# Patient Record
Sex: Male | Born: 1980 | Race: White | Hispanic: No | Marital: Married | Smoking: Former smoker
Health system: Southern US, Community
[De-identification: ages and names within clinical notes are randomized; demographics above are authoritative.]

## PROBLEM LIST (undated history)

## (undated) DIAGNOSIS — K222 Esophageal obstruction: Secondary | ICD-10-CM

## (undated) DIAGNOSIS — K200 Eosinophilic esophagitis: Secondary | ICD-10-CM

## (undated) HISTORY — DX: Eosinophilic esophagitis: K20.0

## (undated) HISTORY — PX: ANKLE ARTHROPLASTY: SUR68

## (undated) HISTORY — DX: Esophageal obstruction: K22.2

## (undated) HISTORY — PX: UPPER GASTROINTESTINAL ENDOSCOPY: SHX188

---
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
## 2001-03-16 ENCOUNTER — Ambulatory Visit (HOSPITAL_BASED_OUTPATIENT_CLINIC_OR_DEPARTMENT_OTHER): Admission: RE | Admit: 2001-03-16 | Discharge: 2001-03-16 | Payer: Self-pay

## 2012-07-23 ENCOUNTER — Other Ambulatory Visit: Payer: Self-pay | Admitting: Occupational Medicine

## 2012-07-23 ENCOUNTER — Ambulatory Visit (HOSPITAL_BASED_OUTPATIENT_CLINIC_OR_DEPARTMENT_OTHER)
Admission: RE | Admit: 2012-07-23 | Discharge: 2012-07-23 | Disposition: A | Discharge status: Home / Self Care | Payer: Self-pay | Source: Ambulatory Visit | Attending: Occupational Medicine | Admitting: Occupational Medicine

## 2012-07-23 ENCOUNTER — Ambulatory Visit (HOSPITAL_COMMUNITY)
Admission: RE | Admit: 2012-07-23 | Discharge: 2012-07-23 | Disposition: A | Discharge status: Home / Self Care | Payer: Self-pay | Source: Ambulatory Visit | Attending: Occupational Medicine | Admitting: Occupational Medicine

## 2012-07-23 DIAGNOSIS — Z0000 Encounter for general adult medical examination without abnormal findings: Secondary | ICD-10-CM | POA: Insufficient documentation

## 2012-07-24 ENCOUNTER — Ambulatory Visit (HOSPITAL_BASED_OUTPATIENT_CLINIC_OR_DEPARTMENT_OTHER)
Admission: RE | Admit: 2012-07-24 | Discharge: 2012-07-24 | Disposition: A | Discharge status: Home / Self Care | Payer: Self-pay | Source: Ambulatory Visit | Attending: Occupational Medicine | Admitting: Occupational Medicine

## 2012-07-24 ENCOUNTER — Other Ambulatory Visit: Payer: Self-pay | Admitting: Occupational Medicine

## 2012-07-24 DIAGNOSIS — Z0000 Encounter for general adult medical examination without abnormal findings: Secondary | ICD-10-CM | POA: Insufficient documentation

## 2015-06-13 ENCOUNTER — Encounter (HOSPITAL_BASED_OUTPATIENT_CLINIC_OR_DEPARTMENT_OTHER): Payer: Self-pay | Admitting: Emergency Medicine

## 2015-06-13 ENCOUNTER — Ambulatory Visit (HOSPITAL_BASED_OUTPATIENT_CLINIC_OR_DEPARTMENT_OTHER)
Admission: EM | Admit: 2015-06-13 | Discharge: 2015-06-13 | Disposition: A | Discharge status: Home / Self Care | Payer: Managed Care, Other (non HMO) | Attending: Gastroenterology | Admitting: Gastroenterology

## 2015-06-13 ENCOUNTER — Encounter (HOSPITAL_COMMUNITY)
Admission: EM | Disposition: A | Discharge status: Home / Self Care | Payer: Self-pay | Source: Home / Self Care | Attending: Emergency Medicine

## 2015-06-13 DIAGNOSIS — R609 Edema, unspecified: Secondary | ICD-10-CM | POA: Insufficient documentation

## 2015-06-13 DIAGNOSIS — T18128A Food in esophagus causing other injury, initial encounter: Secondary | ICD-10-CM

## 2015-06-13 DIAGNOSIS — K209 Esophagitis, unspecified: Secondary | ICD-10-CM | POA: Insufficient documentation

## 2015-06-13 DIAGNOSIS — R1310 Dysphagia, unspecified: Secondary | ICD-10-CM | POA: Diagnosis not present

## 2015-06-13 HISTORY — PX: ESOPHAGOGASTRODUODENOSCOPY: SHX5428

## 2015-06-13 SURGERY — EGD (ESOPHAGOGASTRODUODENOSCOPY)
Anesthesia: Moderate Sedation

## 2015-06-13 MED ORDER — FENTANYL CITRATE (PF) 100 MCG/2ML IJ SOLN
INTRAMUSCULAR | Status: AC
  Filled 2015-06-13: qty 2

## 2015-06-13 MED ORDER — DIPHENHYDRAMINE HCL 50 MG/ML IJ SOLN
INTRAMUSCULAR | Status: DC | PRN
  Administered 2015-06-13 (×2): 25 mg via INTRAVENOUS

## 2015-06-13 MED ORDER — SODIUM CHLORIDE 0.9 % IV SOLN: INTRAVENOUS | Status: DC

## 2015-06-13 MED ORDER — DIPHENHYDRAMINE HCL 50 MG/ML IJ SOLN
INTRAMUSCULAR | Status: AC
  Filled 2015-06-13: qty 1

## 2015-06-13 MED ORDER — OMEPRAZOLE MAGNESIUM 20 MG PO TBEC: 20.0000 mg | DELAYED_RELEASE_TABLET | Freq: Two times a day (BID) | ORAL | Status: DC

## 2015-06-13 MED ORDER — GLUCAGON HCL RDNA (DIAGNOSTIC) 1 MG IJ SOLR
1.0000 mg | Freq: Once | INTRAMUSCULAR | Status: AC
  Administered 2015-06-13: 1 mg via INTRAVENOUS

## 2015-06-13 MED ORDER — ONDANSETRON HCL 4 MG/2ML IJ SOLN
4.0000 mg | Freq: Once | INTRAMUSCULAR | Status: AC
  Administered 2015-06-13: 4 mg via INTRAVENOUS
  Filled 2015-06-13: qty 2

## 2015-06-13 MED ORDER — FENTANYL CITRATE (PF) 100 MCG/2ML IJ SOLN
INTRAMUSCULAR | Status: DC | PRN
  Administered 2015-06-13 (×4): 25 ug via INTRAVENOUS

## 2015-06-13 MED ORDER — SODIUM CHLORIDE 0.9 % IV SOLN
INTRAVENOUS | Status: DC
  Administered 2015-06-13: 500 mL via INTRAVENOUS

## 2015-06-13 MED ORDER — BUTAMBEN-TETRACAINE-BENZOCAINE 2-2-14 % EX AERO
INHALATION_SPRAY | CUTANEOUS | Status: DC | PRN
Start: 2015-06-13 — End: 2015-06-13
  Administered 2015-06-13: 2 via TOPICAL

## 2015-06-13 MED ORDER — MIDAZOLAM HCL 10 MG/2ML IJ SOLN
INTRAMUSCULAR | Status: DC | PRN
  Administered 2015-06-13: 1 mg via INTRAVENOUS
  Administered 2015-06-13 (×2): 2 mg via INTRAVENOUS

## 2015-06-13 MED ORDER — NITROGLYCERIN 0.4 MG SL SUBL
0.4000 mg | SUBLINGUAL_TABLET | SUBLINGUAL | Status: DC | PRN
  Administered 2015-06-13: 0.4 mg via SUBLINGUAL
  Filled 2015-06-13: qty 1

## 2015-06-13 MED ORDER — GLUCAGON HCL RDNA (DIAGNOSTIC) 1 MG IJ SOLR
INTRAMUSCULAR | Status: AC
  Administered 2015-06-13: 1 mg via INTRAVENOUS
  Filled 2015-06-13: qty 1

## 2015-06-13 MED ORDER — MIDAZOLAM HCL 5 MG/ML IJ SOLN
INTRAMUSCULAR | Status: AC
  Filled 2015-06-13: qty 2

## 2015-06-13 NOTE — ED Notes (Signed)
Pt has clear tracheal sounds, speech appears nml, denies any difficulty swallowing.

## 2015-06-13 NOTE — ED Notes (Signed)
Endoscopy RN here to take patient to Endo suite.

## 2015-06-13 NOTE — ED Notes (Signed)
Pt in c/o a food bolus, was eating a pork chop sandwich and a bite in lodged in esophagus. States has hx of same many years ago. Pt airway is intact, he is speaking in complete sentences with no obvious difficulty.

## 2015-06-13 NOTE — ED Notes (Signed)
States he still feels as if something is stuck in throat

## 2015-06-13 NOTE — ED Provider Notes (Signed)
CSN: [id number]     Arrival date & time 06/13/15  1444 History   None    Chief Complaint  Patient presents with  . Food Bolus      (Consider location/radiation/quality/duration/timing/severity/associated sxs/prior Treatment) HPI   Blood pressure 133/74, pulse 86, temperature 98.7 F (37.1 C), temperature source Oral, resp. rate 18, height 5\' 9"  (1.753 m), weight 230 lb (104.327 kg), SpO2 97 %.  Drew Cameron is a 34 y.o. male complaining of food bolus sensation in lower lower esophagus above the sternal notch. Patient states that he was eating a sandwich approximately 12:30 today in he feels that there is a piece lodged. He feels like it's moving slightly lower since onset, he tried to drink water to alleviate it hasn't helped. States it is felt slightly nauseous but he denies any shortness of breath, drooling. He states he had a similar episode when he was states several years ago he was not evaluated for it. In  History reviewed. No pertinent past medical history. History reviewed. No pertinent past surgical history. History reviewed. No pertinent family history. History  Substance Use Topics  . Smoking status: Never Smoker   . Smokeless tobacco: Never Used  . Alcohol Use: No    Review of Systems  10 systems reviewed and found to be negative, except as noted in the HPI.  Allergies  Review of patient's allergies indicates no known allergies.  Home Medications   Prior to Admission medications   Not on File   BP 133/74 mmHg  Pulse 86  Temp(Src) 98.7 F (37.1 C) (Oral)  Resp 18  Ht 5\' 9"  (1.753 m)  Wt 230 lb (104.327 kg)  BMI 33.95 kg/m2  SpO2 97% Physical Exam  Constitutional: He is oriented to person, place, and time. He appears well-developed and well-nourished. No distress.  HENT:  Head: Normocephalic.  Patient tolerating his secretions without S2, posterior pharynx with no trauma or swelling.  Eyes: Conjunctivae and EOM are normal.  Cardiovascular: Normal  rate, regular rhythm and intact distal pulses.   Pulmonary/Chest: Effort normal and breath sounds normal. No stridor. No respiratory distress. He has no wheezes. He has no rales. He exhibits no tenderness.  Abdominal: Soft. Bowel sounds are normal.  Musculoskeletal: Normal range of motion.  Neurological: He is alert and oriented to person, place, and time.  Psychiatric: He has a normal mood and affect.  Nursing note and vitals reviewed.   ED Course  Procedures (including critical care time) Labs Review Labs Reviewed - No data to display  Imaging Review No results found.   EKG Interpretation None      MDM   Final diagnoses:  Food impaction of esophagus, initial encounter    Filed Vitals:   06/13/15 1448  BP: 133/74  Pulse: 86  Temp: 98.7 F (37.1 C)  TempSrc: Oral  Resp: 18  Height: 5\' 9"  (1.753 m)  Weight: 230 lb (104.327 kg)  SpO2: 97%    Medications  nitroGLYCERIN (NITROSTAT) SL tablet 0.4 mg (0.4 mg Sublingual Given 06/13/15 1515)  glucagon (human recombinant) (GLUCAGEN) injection 1 mg (1 mg Intravenous Given 06/13/15 1457)  ondansetron (ZOFRAN) injection 4 mg (4 mg Intravenous Given 06/13/15 1501)    Drew Cameron is a pleasant 34 y.o. male presenting with food bolus likely pork from sandwich which she was eating around noon today. Patient is tolerating his secretions lung sounds are clear. Attempted IV glucagon and sublingual nitroglycerin with no relief. Dr. Fayrene Fearing has consulted gastroenterologist  Dr. Christella Hartigan who will see the patient in the ED at [facility] long.    Wynetta Emery, PA-C 06/13/15 1930  Rolland Porter, MD 06/16/15 [phone]

## 2015-06-13 NOTE — ED Notes (Signed)
Pt informed to be NPO, pending endo procedure

## 2015-06-13 NOTE — ED Provider Notes (Signed)
Pt seen and evaluated.  D/W PA [place].  Pt describes sudden onset of foreign body sensation that his sternal notch at 12:30 while eating a pork sandwich. Continues to describe discomfort. Clear lungs. Normal airway. Given IV glucagon 1 mg, Zofran 4 mg, sublingual nitroglycerin 0.4. Some improvement in discomfort. However, still unable to take by mouth or handle secretions. Care discussed with Dr. Christella Hartigan of lower GI. He request patient transferred to [facility] long for extraction.  Rolland Porter, MD 06/13/15 1630

## 2015-06-13 NOTE — Discharge Instructions (Signed)
Esophagogastroduodenoscopy °Care After °Refer to this sheet in the next few weeks. These instructions provide you with information on caring for yourself after your procedure. Your caregiver may also give you more specific instructions. Your treatment has been planned according to current medical practices, but problems sometimes occur. Call your caregiver if you have any problems or questions after your procedure.  °HOME CARE INSTRUCTIONS °· Do not eat or drink anything until the numbing medicine (local anesthetic) has worn off and your gag reflex has returned. You will know that the local anesthetic has worn off when you can swallow comfortably. °· Do not drive for 12 hours after the procedure or as directed by your caregiver. °· Only take medicines as directed by your caregiver. °SEEK MEDICAL CARE IF:  °· You cannot stop coughing. °· You are not urinating at all or less than usual. °SEEK IMMEDIATE MEDICAL CARE IF: °· You have difficulty swallowing. °· You cannot eat or drink. °· You have worsening throat or chest pain. °· You have dizziness, lightheadedness, or you faint. °· You have nausea or vomiting. °· You have chills. °· You have a fever. °· You have severe abdominal pain. °· You have black, tarry, or bloody stools. °Document Released: 10/10/2012 Document Reviewed: 10/10/2012 °ExitCare® Patient Information ©2015 [employer]. This information is not intended to replace advice given to you by your health care provider. Make sure you discuss any questions you have with your health care provider. ° °

## 2015-06-13 NOTE — Op Note (Signed)
[facility] [address] [place], [address]   ENDOSCOPY PROCEDURE REPORT  PATIENT: Drew Cameron, Drew Cameron  MR#: [id number] BIRTHDATE: [birth year] , 34  yrs. old GENDER: male ENDOSCOPIST: Rachael Fee, MD PROCEDURE DATE:  06/13/2015 PROCEDURE:  EGD w/ fb removal ASA CLASS:     Class I INDICATIONS:  food impaction in esophagus. MEDICATIONS: Benadryl 50 mg IV, Fentanyl 100 mcg IV, and Versed 5 mg IV TOPICAL ANESTHETIC: none  DESCRIPTION OF PROCEDURE: After the risks benefits and alternatives of the procedure were thoroughly explained, informed consent was obtained.  The Pentax Gastroscope [id number] endoscope was introduced through the mouth and advanced to the second portion of the duodenum , Without limitations.  The instrument was slowly withdrawn as the mucosa was fully examined.  There was a medium sized white bolus of food (meat) in the distal esophagus.  This was completely removed with a [employer].  After removal of the bolus, the esophagus was noted to have mild mucosal inflammation at the GE junction, edema without overt stricture. The UGI tract was otherwise normal.  Retroflexed views revealed no abnormalities.     The scope was then withdrawn from the patient and the procedure completed.  COMPLICATIONS: There were no immediate complications.  ENDOSCOPIC IMPRESSION: There was a medium sized white bolus of food (meat) in the distal esophagus.  This was completely removed with a [employer].  After removal of the bolus, the esophagus was noted to have mild mucosal inflammation at the GE junction, edema without overt stricture. The UGI tract was otherwise normal  RECOMMENDATIONS: Please start omeprazole  pill; take one pill 20-30 min before BF and dinner meals.  My office will contact you about repeat EGD in 3-4 weeks to assess for underlying stricture, EOE.  Chew your food well, eat slowly and take small bites.  eSigned:  Rachael Fee, MD  06/13/2015 6:09 PM

## 2015-06-13 NOTE — ED Notes (Signed)
Pt states he feels better, but noted to have a lot of belching, pt unable to drink water, EDP at bedside

## 2015-06-13 NOTE — H&P (View-Only) (Signed)
[facility] Referring Provider: Dr. Mark JAmes (ER at [place]_ Primary Care Physician:  No primary care provider on file. Primary Gastroenterologist:  None  Reason for Consultation: dysphagia   HPI:  Drew Cameron is a 34 y.o. male in usoh until lunch [place] [employer] this afternoon. Eating a pork chop sandwich, food felt to become lodged in substernum.  Has not been able to swallow his saliva. Breathing comfortably.  Went to [facility], nitro IV did not help. I was called and asked that he be transported to [facility] ED.  He does not take any meds. Does not have GERD.  STable weight. HE has had 3-4 episodes where it took an extra swallow or two to get food bolus down but never anything like this. No chest pains.     History reviewed. No pertinent past medical history.  History reviewed. No pertinent past surgical history.  Prior to Admission medications   Not on File    Current Facility-Administered Medications  Medication Dose Route Frequency Provider Last Rate Last Dose  . nitroGLYCERIN (NITROSTAT) SL tablet 0.4 mg  0.4 mg Sublingual Q5 min PRN Mark James, MD   0.4 mg at 06/13/15 1515   No current outpatient prescriptions on file.    Allergies as of 06/13/2015  . (No Known Allergies)    History reviewed. No pertinent family history.  History   Social History  . Marital Status: Married    Spouse Name: N/A  . Number of Children: N/A  . Years of Education: N/A   Occupational History  . Not on file.   Social History Main Topics  . Smoking status: Never Smoker   . Smokeless tobacco: Never Used  . Alcohol Use: No  . Drug Use: Not on file  . Sexual Activity: Not on file   Other Topics Concern  . Not on file   Social History Narrative  . No narrative on file     Review of Systems: Pertinent positive and negative review of systems were noted in the above HPI section. Complete review of systems was performed and was otherwise normal.   Physical  Exam: Vital signs in last 24 hours: Temp:  [98.6 F (37 C)-98.7 F (37.1 C)] 98.6 F (37 C) (08/06 1553) Pulse Rate:  [82-86] 82 (08/06 1553) Resp:  [16-18] 16 (08/06 1553) BP: (110-133)/(68-74) 110/68 mmHg (08/06 1553) SpO2:  [97 %] 97 % (08/06 1553) Weight:  [230 lb (104.327 kg)] 230 lb (104.327 kg) (08/06 1448)   Constitutional: generally well-appearing Psychiatric: alert and oriented x3 Eyes: extraocular movements intact Mouth: oral pharynx moist, no lesions Neck: supple no lymphadenopathy Cardiovascular: heart regular rate and rhythm Lungs: clear to auscultation bilaterally Abdomen: soft, nontender, nondistended, no obvious ascites, no peritoneal signs, normal bowel sounds Extremities: no lower extremity edema bilaterally Skin: no lesions on visible extremities    Impression/Plan: 34 y.o. male with esophageal food impaction  For EGD now.    Eulalia Ellerman P, MD  06/13/2015, 5:13 PM [facility] Pager [phone]    

## 2015-06-13 NOTE — ED Notes (Signed)
Phone Hand Off report given to Josh-RN with [employer]

## 2015-06-13 NOTE — ED Notes (Signed)
Dr Fayrene Fearing, MD now at bedside to evaluate pt

## 2015-06-13 NOTE — Consult Note (Signed)
[facility] Referring Provider: Dr. Rolland Porter (ER at [facility]_ Primary Care Physician:  No primary care provider on file. Primary Gastroenterologist:  None  Reason for Consultation: dysphagia   HPI:  Drew Cameron is a 34 y.o. male in usoh until lunch at [employer] this afternoon. Eating a pork chop sandwich, food felt to become lodged in substernum.  Has not been able to swallow his saliva. Breathing comfortably.  Went to [facility], nitro IV did not help. I was called and asked that he be transported to [facility] ED.  He does not take any meds. Does not have GERD.  STable weight. HE has had 3-4 episodes where it took an extra swallow or two to get food bolus down but never anything like this. No chest pains.     History reviewed. No pertinent past medical history.  History reviewed. No pertinent past surgical history.  Prior to Admission medications   Not on File    Current Facility-Administered Medications  Medication Dose Route Frequency Provider Last Rate Last Dose  . nitroGLYCERIN (NITROSTAT) SL tablet 0.4 mg  0.4 mg Sublingual Q5 min PRN Rolland Porter, MD   0.4 mg at 06/13/15 1515   No current outpatient prescriptions on file.    Allergies as of 06/13/2015  . (No Known Allergies)    History reviewed. No pertinent family history.  History   Social History  . Marital Status: Married    Spouse Name: N/A  . Number of Children: N/A  . Years of Education: N/A   Occupational History  . Not on file.   Social History Main Topics  . Smoking status: Never Smoker   . Smokeless tobacco: Never Used  . Alcohol Use: No  . Drug Use: Not on file  . Sexual Activity: Not on file   Other Topics Concern  . Not on file   Social History Narrative  . No narrative on file     Review of Systems: Pertinent positive and negative review of systems were noted in the above HPI section. Complete review of systems was performed and was otherwise normal.   Physical  Exam: Vital signs in last 24 hours: Temp:  [98.6 F (37 C)-98.7 F (37.1 C)] 98.6 F (37 C) (08/06 1553) Pulse Rate:  [82-86] 82 (08/06 1553) Resp:  [16-18] 16 (08/06 1553) BP: (110-133)/(68-74) 110/68 mmHg (08/06 1553) SpO2:  [97 %] 97 % (08/06 1553) Weight:  [230 lb (104.327 kg)] 230 lb (104.327 kg) (08/06 1448)   Constitutional: generally well-appearing Psychiatric: alert and oriented x3 Eyes: extraocular movements intact Mouth: oral pharynx moist, no lesions Neck: supple no lymphadenopathy Cardiovascular: heart regular rate and rhythm Lungs: clear to auscultation bilaterally Abdomen: soft, nontender, nondistended, no obvious ascites, no peritoneal signs, normal bowel sounds Extremities: no lower extremity edema bilaterally Skin: no lesions on visible extremities    Impression/Plan: 34 y.o. male with esophageal food impaction  For EGD now.    Rachael Fee, MD  06/13/2015, 5:13 PM [facility] Pager [phone]

## 2015-06-13 NOTE — Interval H&P Note (Signed)
History and Physical Interval Note:  06/13/2015 5:39 PM  Drew Cameron  has presented today for surgery, with the diagnosis of esophageal food impaction  The various methods of treatment have been discussed with the patient and family. After consideration of risks, benefits and other options for treatment, the patient has consented to  Procedure(s): ESOPHAGOGASTRODUODENOSCOPY (EGD) (N/A) as a surgical intervention .  The patient's history has been reviewed, patient examined, no change in status, stable for surgery.  I have reviewed the patient's chart and labs.  Questions were answered to the patient's satisfaction.     Rachael Fee

## 2015-06-13 NOTE — ED Notes (Signed)
Pt presents with having a food item stuck in throat, after eating a pork chop sandwich. Having nausea and some drooling upon admission

## 2015-06-13 NOTE — ED Notes (Signed)
GI MD at bedside to explain plan of care to patient. Pt RR even/unlabored. Mosie Lukes given to patient for saliva. No other c/c.

## 2015-06-15 ENCOUNTER — Encounter (HOSPITAL_COMMUNITY): Payer: Self-pay | Admitting: Gastroenterology

## 2015-06-15 ENCOUNTER — Telehealth: Payer: Self-pay

## 2015-06-15 NOTE — Telephone Encounter (Signed)
-----   Message from Rachael Fee, MD sent at 06/13/2015  6:10 PM EDT ----- He needs repeat EGD in [facility] with possible dilation (3-4 weeks) thanks

## 2015-06-16 ENCOUNTER — Encounter: Payer: Self-pay | Admitting: Gastroenterology

## 2015-06-16 NOTE — Telephone Encounter (Signed)
Pt has been scheduled for pre visit and EGD he is aware

## 2015-07-08 ENCOUNTER — Ambulatory Visit (AMBULATORY_SURGERY_CENTER): Payer: Self-pay

## 2015-07-08 VITALS — Ht 69.0 in | Wt 233.2 lb

## 2015-07-08 DIAGNOSIS — K222 Esophageal obstruction: Secondary | ICD-10-CM

## 2015-07-08 NOTE — Progress Notes (Signed)
No allergies to eggs or soy No home oxygen No diet/weight loss meds No past problems with anesthesia  Refused emmi 

## 2015-07-21 ENCOUNTER — Ambulatory Visit (AMBULATORY_SURGERY_CENTER): Payer: Managed Care, Other (non HMO) | Admitting: Gastroenterology

## 2015-07-21 ENCOUNTER — Encounter: Payer: Self-pay | Admitting: Gastroenterology

## 2015-07-21 VITALS — BP 109/68 | HR 64 | Temp 97.0°F | Resp 20 | Ht 69.0 in | Wt 233.0 lb

## 2015-07-21 DIAGNOSIS — R1310 Dysphagia, unspecified: Secondary | ICD-10-CM

## 2015-07-21 DIAGNOSIS — K222 Esophageal obstruction: Secondary | ICD-10-CM | POA: Diagnosis not present

## 2015-07-21 MED ORDER — OMEPRAZOLE MAGNESIUM 20 MG PO TBEC: 20.0000 mg | DELAYED_RELEASE_TABLET | Freq: Every day | ORAL | Status: DC

## 2015-07-21 MED ORDER — SODIUM CHLORIDE 0.9 % IV SOLN: 500.0000 mL | INTRAVENOUS | Status: DC

## 2015-07-21 NOTE — Progress Notes (Signed)
Transferred to recovery room. A/O x3, pleased with MAC.  VSS.  Report to Shelia, RN. 

## 2015-07-21 NOTE — Patient Instructions (Addendum)
YOU HAD AN ENDOSCOPIC PROCEDURE TODAY AT THE [facility]:   Refer to the procedure report that was given to you for any specific questions about what was found during the examination.  If the procedure report does not answer your questions, please call your gastroenterologist to clarify.  If you requested that your care partner not be given the details of your procedure findings, then the procedure report has been included in a sealed envelope for you to review at your convenience later.  YOU SHOULD EXPECT: Some feelings of bloating in the abdomen. Passage of more gas than usual.  Walking can help get rid of the air that was put into your GI tract during the procedure and reduce the bloating. If you had a lower endoscopy (such as a colonoscopy or flexible sigmoidoscopy) you may notice spotting of blood in your stool or on the toilet paper. If you underwent a bowel prep for your procedure, you may not have a normal bowel movement for a few days.  Please Note:  You might notice some irritation and congestion in your nose or some drainage.  This is from the oxygen used during your procedure.  There is no need for concern and it should clear up in a day or so.  SYMPTOMS TO REPORT IMMEDIATELY:     Following upper endoscopy (EGD)  Vomiting of blood or coffee ground material  New chest pain or pain under the shoulder blades  Painful or persistently difficult swallowing  New shortness of breath  Fever of 100F or higher  Black, tarry-looking stools  For urgent or emergent issues, a gastroenterologist can be reached at any hour by calling [phone].   DIET: FOLLOW DILATION HANDOUT.  ACTIVITY:  You should plan to take it easy for the rest of today and you should NOT DRIVE or use heavy machinery until tomorrow (because of the sedation medicines used during the test).    FOLLOW UP: Our staff will call the number listed on your records the next business day following your procedure to  check on you and address any questions or concerns that you may have regarding the information given to you following your procedure. If we do not reach you, we will leave a message.  However, if you are feeling well and you are not experiencing any problems, there is no need to return our call.  We will assume that you have returned to your regular daily activities without incident.  If any biopsies were taken you will be contacted by phone or by letter within the next 1-3 weeks.  Please call us at [phone] if you have not heard about the biopsies in 3 weeks.    SIGNATURES/CONFIDENTIALITY: You and/or your care partner have signed paperwork which will be entered into your electronic medical record.  These signatures attest to the fact that that the information above on your After Visit Summary has been reviewed and is understood.  Full responsibility of the confidentiality of this discharge information lies with you and/or your care-partner.  Decrease prilosec to once daily, resume remainder of medication. Dilation Diet Given.

## 2015-07-21 NOTE — Op Note (Signed)
[facility] [address] [place], [address]   ENDOSCOPY PROCEDURE REPORT  PATIENT: Drew, Cameron  MR#: [id number] BIRTHDATE: 21-Sep-1981 , 34  yrs. old GENDER: male ENDOSCOPIST: Rachael Fee, MD PROCEDURE DATE:  07/21/2015 PROCEDURE:  EGD w/ biopsy and EGD w/ balloon dilation ASA CLASS:     Class II INDICATIONS:  intermittent dysphagia, 1 month ago overt food impaction. MEDICATIONS: Monitored anesthesia care and Propofol 350 mg IV TOPICAL ANESTHETIC: none  DESCRIPTION OF PROCEDURE: After the risks benefits and alternatives of the procedure were thoroughly explained, informed consent was obtained.  The LB [id number] endoscope was introduced through the mouth and advanced to the second portion of the duodenum , Without limitations.  The instrument was slowly withdrawn as the mucosa was fully examined.  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  This was minor and was dilated with TTS balloon held inflated to 18mm for one minute.  There was no bleeding following the dilation.  There were linear furrows and some circumferential rings through the esophagus and so I biopsied the distal and proximal esophagus and sent to pathology.  The examination was otherwise normal.  Retroflexed views revealed no abnormalities.     The scope was then withdrawn from the patient and the procedure completed.  COMPLICATIONS: There were no immediate complications.  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  This was minor and was dilated with TTS balloon held inflated to 18mm for one minute.  There was no bleeding following the dilation.  There were linear furrows and some circumferential rings through the esophagus and so I biopsied the distal and proximal esophagus and sent to pathology.  The examination was otherwise  normal  RECOMMENDATIONS: Await final pathology.  Ok to decrease to once daily prilosec for now.   eSigned:  Rachael Fee, MD 07/21/2015 10:19 AM

## 2015-07-21 NOTE — Progress Notes (Signed)
Called to room to assist during endoscopic procedure.  Patient ID and intended procedure confirmed with present staff. Received instructions for my participation in the procedure from the performing physician.  

## 2015-07-22 ENCOUNTER — Telehealth: Payer: Self-pay

## 2015-07-22 NOTE — Telephone Encounter (Signed)
  Follow up Call-  Call back number 07/21/2015  Post procedure Call Back phone  # [phone]  Permission to leave phone message Yes     Patient questions:  Do you have a fever, pain , or abdominal swelling? No. Pain Score  0 *  Have you tolerated food without any problems? Yes.    Have you been able to return to your normal activities? Yes.    Do you have any questions about your discharge instructions: Diet   No. Medications  No. Follow up visit  No.  Do you have questions or concerns about your Care? No.  Actions: * If pain score is 4 or above: No action needed, pain <4.

## 2015-08-04 ENCOUNTER — Other Ambulatory Visit: Payer: Self-pay

## 2015-08-04 MED ORDER — OMEPRAZOLE 40 MG PO CPDR: 40.0000 mg | DELAYED_RELEASE_CAPSULE | Freq: Two times a day (BID) | ORAL | Status: DC

## 2015-08-04 NOTE — Telephone Encounter (Signed)
rx sent

## 2019-12-02 ENCOUNTER — Emergency Department (HOSPITAL_BASED_OUTPATIENT_CLINIC_OR_DEPARTMENT_OTHER)
Admission: EM | Admit: 2019-12-02 | Discharge: 2019-12-02 | Disposition: A | Discharge status: Home / Self Care | Payer: Managed Care, Other (non HMO) | Attending: Emergency Medicine | Admitting: Emergency Medicine

## 2019-12-02 ENCOUNTER — Encounter (HOSPITAL_BASED_OUTPATIENT_CLINIC_OR_DEPARTMENT_OTHER): Payer: Self-pay | Admitting: *Deleted

## 2019-12-02 ENCOUNTER — Other Ambulatory Visit: Payer: Self-pay

## 2019-12-02 ENCOUNTER — Emergency Department (HOSPITAL_BASED_OUTPATIENT_CLINIC_OR_DEPARTMENT_OTHER): Payer: Managed Care, Other (non HMO)

## 2019-12-02 DIAGNOSIS — N23 Unspecified renal colic: Secondary | ICD-10-CM

## 2019-12-02 DIAGNOSIS — Z79899 Other long term (current) drug therapy: Secondary | ICD-10-CM | POA: Diagnosis not present

## 2019-12-02 DIAGNOSIS — N201 Calculus of ureter: Secondary | ICD-10-CM

## 2019-12-02 DIAGNOSIS — N132 Hydronephrosis with renal and ureteral calculous obstruction: Secondary | ICD-10-CM | POA: Insufficient documentation

## 2019-12-02 DIAGNOSIS — Z87891 Personal history of nicotine dependence: Secondary | ICD-10-CM | POA: Insufficient documentation

## 2019-12-02 DIAGNOSIS — R1032 Left lower quadrant pain: Secondary | ICD-10-CM | POA: Diagnosis present

## 2019-12-02 LAB — URINALYSIS, MICROSCOPIC (REFLEX): Squamous Epithelial / LPF: NONE SEEN (ref 0–5)

## 2019-12-02 LAB — URINALYSIS, ROUTINE W REFLEX MICROSCOPIC
Bilirubin Urine: NEGATIVE
Glucose, UA: NEGATIVE mg/dL
Ketones, ur: NEGATIVE mg/dL
Leukocytes,Ua: NEGATIVE
Nitrite: NEGATIVE
Protein, ur: NEGATIVE mg/dL
Specific Gravity, Urine: >1.03 — ABNORMAL HIGH (ref 1.005–1.030)
pH: 6 (ref 5.0–8.0)

## 2019-12-02 MED ORDER — ONDANSETRON HCL 4 MG PO TABS: 4.0000 mg | ORAL_TABLET | Freq: Three times a day (TID) | ORAL | 0 refills | Status: DC | PRN

## 2019-12-02 MED ORDER — HYDROMORPHONE HCL 1 MG/ML IJ SOLN
1.0000 mg | Freq: Once | INTRAMUSCULAR | Status: AC
  Administered 2019-12-02: 1 mg via INTRAVENOUS
  Filled 2019-12-02: qty 1

## 2019-12-02 MED ORDER — KETOROLAC TROMETHAMINE 15 MG/ML IJ SOLN
15.0000 mg | Freq: Once | INTRAMUSCULAR | Status: AC
  Administered 2019-12-02: 15 mg via INTRAVENOUS
  Filled 2019-12-02: qty 1

## 2019-12-02 MED ORDER — IBUPROFEN 600 MG PO TABS: 600.0000 mg | ORAL_TABLET | Freq: Four times a day (QID) | ORAL | 0 refills | Status: DC | PRN

## 2019-12-02 MED ORDER — OXYCODONE-ACETAMINOPHEN 5-325 MG PO TABS: 1.0000 | ORAL_TABLET | ORAL | 0 refills | Status: DC | PRN

## 2019-12-02 MED ORDER — ONDANSETRON HCL 4 MG/2ML IJ SOLN
4.0000 mg | Freq: Once | INTRAMUSCULAR | Status: AC
  Administered 2019-12-02: 4 mg via INTRAVENOUS
  Filled 2019-12-02: qty 2

## 2019-12-02 NOTE — ED Notes (Signed)
Pt transported to CT ?

## 2019-12-02 NOTE — Discharge Instructions (Signed)
You have a 3 mm kidney stone passing on your left side.  I've included a copy of your CT report for your review.  Talk to your doctor about scheduling an outpatient ultrasound of your kidney for the lesion seen on the left kidney.

## 2019-12-02 NOTE — ED Provider Notes (Signed)
[facility] EMERGENCY DEPARTMENT Provider Note   CSN: [id number] Arrival date & time: 12/02/19  1811     History Chief Complaint  Patient presents with  . Flank Pain  . Abdominal Pain    Drew Cameron is a 39 y.o. male with a history of kidney stones present emergency department left-sided flank pain and dysuria.  Patient ports onset of symptoms this morning.  He says he is having pain in his left flank that wraps around towards his groin.  The pain is worse with urination.  He also reports nausea and vomiting.  He denies any fevers or chills.  He said his only had one kidney stone in the past and it came out spontaneously.  No other medical problems NKDA  HPI     Past Medical History:  Diagnosis Date  . Esophageal stricture     Patient Active Problem List   Diagnosis Date Noted  . Food impaction of esophagus     Past Surgical History:  Procedure Laterality Date  . ESOPHAGOGASTRODUODENOSCOPY N/A 06/13/2015   Procedure: ESOPHAGOGASTRODUODENOSCOPY (EGD);  Surgeon: Milus Banister, MD;  Location: [facility];  Service: Endoscopy;  Laterality: N/A;       Family History  Problem Relation Age of Onset  . Hiatal hernia Father   . Heart attack Father   . Prostate cancer Maternal Grandfather   . Colon cancer Neg Hx   . Colon polyps Neg Hx   . Stomach cancer Neg Hx     Social History   Tobacco Use  . Smoking status: Former Smoker    Years: 10.00  . Smokeless tobacco: Current User    Types: Chew  Substance Use Topics  . Alcohol use: Yes    Alcohol/week: 0.0 standard drinks    Comment: a few times yearly  . Drug use: No    Home Medications Prior to Admission medications   Medication Sig Start Date End Date Taking? Authorizing Provider  Multiple Vitamin (MULTIVITAMIN) tablet Take 1 tablet by mouth daily. Men's   Yes [provider]  Omega-3 Fatty Acids (FISH OIL) 1000 MG CAPS Take by mouth.   Yes [provider]  ibuprofen  (ADVIL) 600 MG tablet Take 1 tablet (600 mg total) by mouth every 6 (six) hours as needed. 12/02/19   Wyvonnia Dusky, MD  omeprazole (PRILOSEC) 40 MG capsule Take 1 capsule (40 mg total) by mouth 2 (two) times daily. 08/04/15   Milus Banister, MD  ondansetron (ZOFRAN) 4 MG tablet Take 1 tablet (4 mg total) by mouth every 8 (eight) hours as needed for up to 15 doses for nausea or vomiting. 12/02/19   Wyvonnia Dusky, MD  oxyCODONE-acetaminophen (PERCOCET/ROXICET) 5-325 MG tablet Take 1 tablet by mouth every 4 (four) hours as needed for up to 10 doses for severe pain. 12/02/19   Wyvonnia Dusky, MD    Allergies    Patient has no known allergies.  Review of Systems   Review of Systems  Constitutional: Negative for chills and fever.  Respiratory: Negative for cough and shortness of breath.   Cardiovascular: Negative for chest pain and palpitations.  Gastrointestinal: Positive for abdominal pain, nausea and vomiting.  Genitourinary: Positive for difficulty urinating, dysuria and flank pain.  Skin: Negative for color change and rash.  Psychiatric/Behavioral: Negative for agitation and confusion.  All other systems reviewed and are negative.   Physical Exam Updated Vital Signs BP (!) 146/84   Pulse 61   Temp  97.8 F (36.6 C) (Oral)   Resp 16   Ht 5\' 9"  (1.753 m)   Wt 97.5 kg   SpO2 100%   BMI 31.75 kg/m   Physical Exam Vitals and nursing note reviewed.  Constitutional:      General: He is in acute distress.     Appearance: He is well-developed.  HENT:     Head: Normocephalic and atraumatic.  Eyes:     Conjunctiva/sclera: Conjunctivae normal.  Cardiovascular:     Rate and Rhythm: Normal rate and regular rhythm.     Heart sounds: No murmur.  Pulmonary:     Effort: Pulmonary effort is normal. No respiratory distress.     Breath sounds: Normal breath sounds.  Abdominal:     Palpations: Abdomen is soft.     Tenderness: There is no abdominal tenderness. There is no right  CVA tenderness, left CVA tenderness, guarding or rebound.  Musculoskeletal:     Cervical back: Neck supple.  Skin:    General: Skin is warm and dry.  Neurological:     Mental Status: He is alert.  Psychiatric:        Mood and Affect: Mood normal.        Behavior: Behavior normal.     ED Results / Procedures / Treatments   Labs (all labs ordered are listed, but only abnormal results are displayed) Labs Reviewed  URINALYSIS, ROUTINE W REFLEX MICROSCOPIC - Abnormal; Notable for the following components:      Result Value   Specific Gravity, Urine >1.030 (*)    Hgb urine dipstick LARGE (*)    All other components within normal limits  URINALYSIS, MICROSCOPIC (REFLEX) - Abnormal; Notable for the following components:   Bacteria, UA FEW (*)    All other components within normal limits    EKG None  Radiology CT Renal Stone Study  Result Date: 12/02/2019 CLINICAL DATA:  Left flank pain EXAM: CT ABDOMEN AND PELVIS WITHOUT CONTRAST TECHNIQUE: Multidetector CT imaging of the abdomen and pelvis was performed following the standard protocol without IV contrast. COMPARISON:  None. FINDINGS: Lower chest: Lung bases are clear. No effusions. Heart is normal size. Hepatobiliary: Small layering gallstone within the gallbladder. No focal hepatic abnormality. Pancreas: No focal abnormality or ductal dilatation. Spleen: No focal abnormality.  Normal size. Adrenals/Urinary Tract: Punctate nonobstructing stone in the midpole of the left kidney. Mild left hydronephrosis due to 3 mm mid left ureteral stone. No additional ureteral stones. No hydronephrosis on the right. Adrenal glands and urinary bladder unremarkable. 1.5 cm low-density lesion in the midpole of the left kidney cannot be characterized on this noncontrast study. Stomach/Bowel: Stomach, large and small bowel grossly unremarkable. Appendix normal. Vascular/Lymphatic: No evidence of aneurysm or adenopathy. Reproductive: No visible focal  abnormality. Other: No free fluid or free air. Musculoskeletal: No acute bony abnormality. Bilateral L4 pars defects are noted. Slight anterolisthesis of L4 on L5. Degenerative disc disease at L4-5. IMPRESSION: 3 mm mid left ureteral stone with mild left hydronephrosis. Punctate nephrolithiasis in the left midpole. 1.5 cm low-density lesion in the midpole of the left kidney which cannot be characterized on this noncontrast study. This could be further evaluated with elective outpatient renal ultrasound if felt clinically indicated. Cholelithiasis. 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 Degenerative disc disease at L4-5. Electronically Signed   By: 12/04/2019 M.D.   On: 12/02/2019 19:06    Procedures Procedures (including critical care time)  Medications Ordered in ED Medications  HYDROmorphone (DILAUDID) injection  1 mg (1 mg Intravenous Given 12/02/19 1901)  ketorolac (TORADOL) 15 MG/ML injection 15 mg (15 mg Intravenous Given 12/02/19 1900)  ondansetron (ZOFRAN) injection 4 mg (4 mg Intravenous Given 12/02/19 1859)    ED Course  I have reviewed the triage vital signs and the nursing notes.  Pertinent labs & imaging results that were available during my care of the patient were reviewed by me and considered in my medical decision making (see chart for details).  39 yo male w/ hx of kidney stones here with sudden onset left flank pain, dysuria, nausea and vomiting.  CT scan with 3 mm left sided mid-ureteral stone.  Blood in urine, but no leuks or nitrites.  Do not suspect infection, pyelonephritis, or sepsis.  Will treat with IV medications, discharge home if pain and nausea better controlled.  Clinical Course as of Dec 01 2316  Mon Dec 02, 2019  1941 Feeling significantly better, will discharge   [MT]    Clinical Course User Index [MT] Toluwani Yadav, Kermit Balo, MD    Final Clinical Impression(s) / ED Diagnoses Final diagnoses:  Ureteral stone  Renal colic    Rx / DC Orders  ED Discharge Orders         Ordered    ibuprofen (ADVIL) 600 MG tablet  Every 6 hours PRN     12/02/19 1944    oxyCODONE-acetaminophen (PERCOCET/ROXICET) 5-325 MG tablet  Every 4 hours PRN     12/02/19 1944    ondansetron (ZOFRAN) 4 MG tablet  Every 8 hours PRN     12/02/19 1944           Terald Sleeper, MD 12/02/19 2318

## 2019-12-02 NOTE — ED Triage Notes (Signed)
Left flank pain and abdominal pain. Hx of kidney stones. Nausea.

## 2019-12-05 ENCOUNTER — Other Ambulatory Visit (HOSPITAL_BASED_OUTPATIENT_CLINIC_OR_DEPARTMENT_OTHER): Payer: Self-pay | Admitting: Physician Assistant

## 2019-12-05 ENCOUNTER — Other Ambulatory Visit (HOSPITAL_BASED_OUTPATIENT_CLINIC_OR_DEPARTMENT_OTHER): Payer: Self-pay | Admitting: *Deleted

## 2019-12-05 ENCOUNTER — Other Ambulatory Visit: Payer: Self-pay

## 2019-12-05 ENCOUNTER — Ambulatory Visit (HOSPITAL_BASED_OUTPATIENT_CLINIC_OR_DEPARTMENT_OTHER)
Admission: RE | Admit: 2019-12-05 | Discharge: 2019-12-05 | Disposition: A | Discharge status: Home / Self Care | Payer: Managed Care, Other (non HMO) | Source: Ambulatory Visit | Attending: Physician Assistant | Admitting: Physician Assistant

## 2019-12-05 DIAGNOSIS — R9389 Abnormal findings on diagnostic imaging of other specified body structures: Secondary | ICD-10-CM

## 2019-12-06 ENCOUNTER — Other Ambulatory Visit (HOSPITAL_BASED_OUTPATIENT_CLINIC_OR_DEPARTMENT_OTHER): Payer: Managed Care, Other (non HMO)

## 2020-05-20 ENCOUNTER — Telehealth: Payer: Self-pay | Admitting: Gastroenterology

## 2020-05-20 NOTE — Telephone Encounter (Signed)
The pt has not been seen since 2016, he complains of dysphagia with solid foods.  I have scheduled an appt with Dr Christella Hartigan for 9/15.  He has been advised to chew slowly, take small bites and if he gets food stuck and has any trouble breathing he should get to the ED ASAP.  The pt has been advised of the information and verbalized understanding.

## 2020-07-22 ENCOUNTER — Encounter: Payer: Self-pay | Admitting: Gastroenterology

## 2020-07-22 ENCOUNTER — Ambulatory Visit: Payer: Managed Care, Other (non HMO) | Admitting: Gastroenterology

## 2020-07-22 VITALS — BP 130/80 | HR 80 | Ht 69.0 in | Wt 239.0 lb

## 2020-07-22 DIAGNOSIS — R1310 Dysphagia, unspecified: Secondary | ICD-10-CM | POA: Diagnosis not present

## 2020-07-22 MED ORDER — OMEPRAZOLE 40 MG PO CPDR: 40.0000 mg | DELAYED_RELEASE_CAPSULE | Freq: Two times a day (BID) | ORAL | 3 refills | Status: DC

## 2020-07-22 NOTE — Progress Notes (Signed)
Review of pertinent gastrointestinal problems: 1.  Dysphagia led to EGD 2016 Dr. Christella Hartigan.  I dilated a focal stricture at his GE junction that was clearly benign.  I felt it was either peptic versus Schatzki's ring versus eosinophilic esophagitis associated.  There is some endoscopic findings suggestive of eosinophilic esophagitis; linear furrows and circumferential rings.  Biopsies were taken from distal and proximal esophagus which were consistent with eosinophilic esophagitis.  I recommended repeat EGD while on twice daily PPI.  We have not heard from him since.   HPI: This is a very pleasant 39 year old man whom I last saw about 5 years ago  I last saw him about 5 years ago, see those interactions summarized above.  After the EGD and dilation 5 years ago he had 0 dysphagia for at least 4-1/2 years.  Starting 2 or 3 months ago he began to have some dysphagia again.  He had an overt food impaction with chicken sandwich that lasted for about 3 hours.  He came very close to going to the emergency room.  He has also had minor intermittent reflux.  His weight has fluctuated he joined weight watchers about a year ago and lost 40 pounds but then he was diagnosed with Covid March 2021 and gained a lot of weight since then.  He has been very careful about chewing his food very well and as long as he is careful he does not have dysphagia.  Liquids do not cause troubles at all.  Review of systems: Pertinent positive and negative review of systems were noted in the above HPI section. All other review negative.   Past Medical History:  Diagnosis Date  . Esophageal stricture     Past Surgical History:  Procedure Laterality Date  . ESOPHAGOGASTRODUODENOSCOPY N/A 06/13/2015   Procedure: ESOPHAGOGASTRODUODENOSCOPY (EGD);  Surgeon: Rachael Fee, MD;  Location: [facility];  Service: Endoscopy;  Laterality: N/A;  . UPPER GASTROINTESTINAL ENDOSCOPY      Current Outpatient Medications  Medication Sig  Dispense Refill  . fexofenadine (ALLEGRA) 180 MG tablet Take 180 mg by mouth daily.    Marland Kitchen ibuprofen (ADVIL) 600 MG tablet Take 1 tablet (600 mg total) by mouth every 6 (six) hours as needed. 30 tablet 0  . Multiple Vitamin (MULTIVITAMIN) tablet Take 1 tablet by mouth daily. Men's    . Omega-3 Fatty Acids (FISH OIL) 1000 MG CAPS Take 1 capsule by mouth daily.      No current facility-administered medications for this visit.    Allergies as of 07/22/2020  . (No Known Allergies)    Family History  Problem Relation Age of Onset  . Hiatal hernia Father   . Heart attack Father   . Heart disease Father   . Prostate cancer Maternal Grandfather   . Colon cancer Maternal Grandfather   . Lung cancer Maternal Grandfather   . Colon polyps Neg Hx   . Stomach cancer Neg Hx     Social History   Socioeconomic History  . Marital status: Married    Spouse name: Not on file  . Number of children: Not on file  . Years of education: Not on file  . Highest education level: Not on file  Occupational History  . Not on file  Tobacco Use  . Smoking status: Former Smoker    Years: 10.00  . Smokeless tobacco: Current User    Types: Chew  Vaping Use  . Vaping Use: Never used  Substance and Sexual Activity  . Alcohol use:  Yes    Alcohol/week: 0.0 standard drinks    Comment: a few times yearly  . Drug use: No  . Sexual activity: Not on file  Other Topics Concern  . Not on file  Social History Narrative  . Not on file   Social Determinants of Health   Financial Resource Strain:   . Difficulty of Paying Living Expenses: Not on file  Food Insecurity:   . Worried About Programme researcher, broadcasting/film/video in the Last Year: Not on file  . Ran Out of Food in the Last Year: Not on file  Transportation Needs:   . Lack of Transportation (Medical): Not on file  . Lack of Transportation (Non-Medical): Not on file  Physical Activity:   . Days of Exercise per Week: Not on file  . Minutes of Exercise per Session:  Not on file  Stress:   . Feeling of Stress : Not on file  Social Connections:   . Frequency of Communication with Friends and Family: Not on file  . Frequency of Social Gatherings with Friends and Family: Not on file  . Attends Religious Services: Not on file  . Active Member of Clubs or Organizations: Not on file  . Attends Banker Meetings: Not on file  . Marital Status: Not on file  Intimate Partner Violence:   . Fear of Current or Ex-Partner: Not on file  . Emotionally Abused: Not on file  . Physically Abused: Not on file  . Sexually Abused: Not on file     Physical Exam: BP 130/80   Pulse 80   Ht 5\' 9"  (1.753 m)   Wt 239 lb (108.4 kg)   BMI 35.29 kg/m  Constitutional: generally well-appearing Psychiatric: alert and oriented x3 Eyes: extraocular movements intact Mouth: oral pharynx moist, no lesions Neck: supple no lymphadenopathy Cardiovascular: heart regular rate and rhythm Lungs: clear to auscultation bilaterally Abdomen: soft, nontender, nondistended, no obvious ascites, no peritoneal signs, normal bowel sounds Extremities: no lower extremity edema bilaterally Skin: no lesions on visible extremities   Assessment and plan: 39 y.o. 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  I recommended that we pick up his work-up where he left off in 2016 and so he will start proton pump inhibitor twice daily and in 4 or 5 weeks we will repeat EGD.  He knows to be very careful about chewing his food very well in the meantime.   Please see the "Patient Instructions" section for addition details about the plan.   2017, MD [facility] 07/22/2020, 9:01 AM  Cc: 07/24/2020, PA-C  Total time on date of encounter was 45  minutes (this included time spent preparing to see the patient reviewing records; obtaining and/or reviewing separately obtained history; performing a medically appropriate exam  and/or evaluation; counseling and educating the patient and family if present; ordering medications, tests or procedures if applicable; and documenting clinical information in the health record).

## 2020-07-22 NOTE — Addendum Note (Signed)
Addended by: Lamona Curl on: 07/22/2020 09:50 AM   Modules accepted: Orders

## 2020-07-22 NOTE — Patient Instructions (Signed)
If you are age 40 or older, your body mass index should be between 23-30. Your Body mass index is 35.29 kg/m. If this is out of the aforementioned range listed, please consider follow up with your Primary Care Provider.  If you are age 65 or younger, your body mass index should be between 19-25. Your Body mass index is 35.29 kg/m. If this is out of the aformentioned range listed, please consider follow up with your Primary Care Provider.   You have been scheduled for an endoscopy. Please follow written instructions given to you at your visit today. If you use inhalers (even only as needed), please bring them with you on the day of your procedure.  Due to recent changes in healthcare laws, you may see the results of your imaging and laboratory studies on MyChart before your provider has had a chance to review them.  We understand that in some cases there may be results that are confusing or concerning to you. Not all laboratory results come back in the same time frame and the provider may be waiting for multiple results in order to interpret others.  Please give [place] 48 hours in order for your provider to thoroughly review all the results before contacting the office for clarification of your results.   We have sent the following medications to your pharmacy for you to pick up at your convenience:  START:  Omeprazole 40mg  take one capsule twice daily before meals.  Thank you for entrusting me with your care and choosing [facility].  Dr [facility]

## 2020-08-14 ENCOUNTER — Telehealth: Payer: Self-pay | Admitting: Gastroenterology

## 2020-08-14 MED ORDER — OMEPRAZOLE 40 MG PO CPDR: 40.0000 mg | DELAYED_RELEASE_CAPSULE | Freq: Two times a day (BID) | ORAL | 3 refills | Status: DC

## 2020-08-14 NOTE — Telephone Encounter (Signed)
Rx for omeprazole sent to [facility] pharmacy as requested.

## 2020-08-14 NOTE — Telephone Encounter (Signed)
Pt needs rf for Omeprazole sent to [facility] in [facility]. He said that his insurance only covers 1 pill a day now so he is not sure of what to do because he takes 1 pill BID.

## 2020-08-21 ENCOUNTER — Ambulatory Visit (INDEPENDENT_AMBULATORY_CARE_PROVIDER_SITE_OTHER): Payer: Managed Care, Other (non HMO)

## 2020-08-21 ENCOUNTER — Other Ambulatory Visit: Payer: Self-pay | Admitting: Gastroenterology

## 2020-08-21 DIAGNOSIS — Z1159 Encounter for screening for other viral diseases: Secondary | ICD-10-CM

## 2020-08-21 LAB — SARS CORONAVIRUS 2 (TAT 6-24 HRS): SARS Coronavirus 2: NEGATIVE

## 2020-08-24 ENCOUNTER — Other Ambulatory Visit: Payer: Self-pay

## 2020-08-25 ENCOUNTER — Encounter: Payer: Managed Care, Other (non HMO) | Admitting: Gastroenterology

## 2020-08-28 ENCOUNTER — Telehealth: Payer: Self-pay

## 2020-08-28 ENCOUNTER — Encounter: Payer: Self-pay | Admitting: Gastroenterology

## 2020-08-28 ENCOUNTER — Other Ambulatory Visit: Payer: Self-pay

## 2020-08-28 ENCOUNTER — Ambulatory Visit (AMBULATORY_SURGERY_CENTER): Payer: Managed Care, Other (non HMO) | Admitting: Gastroenterology

## 2020-08-28 VITALS — BP 133/82 | HR 73 | Temp 98.6°F | Resp 18 | Ht 69.0 in | Wt 239.0 lb

## 2020-08-28 DIAGNOSIS — R1310 Dysphagia, unspecified: Secondary | ICD-10-CM | POA: Diagnosis not present

## 2020-08-28 DIAGNOSIS — K200 Eosinophilic esophagitis: Secondary | ICD-10-CM

## 2020-08-28 MED ORDER — SODIUM CHLORIDE 0.9 % IV SOLN: 500.0000 mL | Freq: Once | INTRAVENOUS | Status: DC

## 2020-08-28 NOTE — Op Note (Signed)
[facility] Patient Name: Drew Cameron [facility] Procedure Date: 08/28/2020 2:18 PM MRN: [id number] Endoscopist: Rachael Fee , MD Age: 39 Referring MD:  Date of Birth: [birth year] Gender: Male Account #: [id number] Procedure:                Upper GI endoscopy Indications:              Dysphagia EGD 2016 Dr. Christella Hartigan. focal stricture at                            his GE junction that was clearly benign (peptic                            versus Schatzki's ring versus eosinophilic                            esophagitis). There were some endoscopic findings                            suggestive of eosinophilic esophagitis; linear                            furrows and circumferential rings. Biopsies were                            taken from distal and proximal esophagus which were                            consistent with eosinophilic esophagitis. He has                            been on BID PPI for about 1 month now. Medicines:                Monitored Anesthesia Care Procedure:                Pre-Anesthesia Assessment:                           - Prior to the procedure, a History and Physical                            was performed, and patient medications and                            allergies were reviewed. The patient's tolerance of                            previous anesthesia was also reviewed. The risks                            and benefits of the procedure and the sedation                            options and risks were discussed with the patient.  All questions were answered, and informed consent                            was obtained. Prior Anticoagulants: The patient has                            taken no previous anticoagulant or antiplatelet                            agents. ASA Grade Assessment: II - A patient with                            mild systemic disease. After reviewing the risks                            and  benefits, the patient was deemed in                            satisfactory condition to undergo the procedure.                           After obtaining informed consent, the endoscope was                            passed under direct vision. Throughout the                            procedure, the patient's blood pressure, pulse, and                            oxygen saturations were monitored continuously. The                            Endoscope was introduced through the mouth, and                            advanced to the second part of duodenum. The upper                            GI endoscopy was accomplished without difficulty.                            The patient tolerated the procedure well. Scope In: Scope Out: Findings:                 Mucosal changes including longitudinal furrows and                            circumferential folds were found in the entire                            esophagus. Biopsies were obtained from the proximal  and distal esophagus with cold forceps for                            histology of suspected eosinophilic esophagitis.                           No esophageal strictures.                           The exam was otherwise without abnormality. Complications:            No immediate complications. Estimated blood loss:                            None. Estimated Blood Loss:     Estimated blood loss: none. 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 Biopsies taken (on PPI BID)                           - The examination was otherwise normal. Recommendation:           - Patient has a contact number available for                            emergencies. The signs and symptoms of potential                            delayed complications were discussed with the                            patient. Return to normal activities tomorrow.                             Written discharge instructions were provided to the                            patient.                           - Resume previous diet.                           - Continue present medications. Twice daily PPI.                           - Await pathology results. Rachael Fee, MD 08/28/2020 2:29:56 PM This report has been signed electronically.

## 2020-08-28 NOTE — Progress Notes (Signed)
1417 Robinul 0.1 mg IV given due large amount of secretions upon assessment.  MD made aware, vss

## 2020-08-28 NOTE — Progress Notes (Signed)
Report given to PACU, vss 

## 2020-08-28 NOTE — Telephone Encounter (Signed)
Pt. Called to confirm what time he is supposed to arrive for his procedure today.  Told pt. His procedure is scheduled for 2 p.m. today, and to arrive one hour early, at 1 p.m. today.  Told pt. We look forward to seeing him at 1 p.m. today.

## 2020-08-28 NOTE — Progress Notes (Signed)
Called to room to assist during endoscopic procedure.  Patient ID and intended procedure confirmed with present staff. Received instructions for my participation in the procedure from the performing physician.  

## 2020-08-28 NOTE — Patient Instructions (Signed)
YOU HAD AN ENDOSCOPIC PROCEDURE TODAY AT THE [facility]:   Refer to the procedure report that was given to you for any specific questions about what was found during the examination.  If the procedure report does not answer your questions, please call your gastroenterologist to clarify.  If you requested that your care partner not be given the details of your procedure findings, then the procedure report has been included in a sealed envelope for you to review at your convenience later.  YOU SHOULD EXPECT: Some feelings of bloating in the abdomen. Passage of more gas than usual.  Walking can help get rid of the air that was put into your GI tract during the procedure and reduce the bloating. If you had a lower endoscopy (such as a colonoscopy or flexible sigmoidoscopy) you may notice spotting of blood in your stool or on the toilet paper. If you underwent a bowel prep for your procedure, you may not have a normal bowel movement for a few days.  Please Note:  You might notice some irritation and congestion in your nose or some drainage.  This is from the oxygen used during your procedure.  There is no need for concern and it should clear up in a day or so.  SYMPTOMS TO REPORT IMMEDIATELY:    Following upper endoscopy (EGD)  Vomiting of blood or coffee ground material  New chest pain or pain under the shoulder blades  Painful or persistently difficult swallowing  New shortness of breath  Fever of 100F or higher  Black, tarry-looking stools  For urgent or emergent issues, a gastroenterologist can be reached at any hour by calling [phone]. Do not use MyChart messaging for urgent concerns.    DIET:  We do recommend a small meal at first, but then you may proceed to your regular diet.  Drink plenty of fluids but you should avoid alcoholic beverages for 24 hours.  ACTIVITY:  You should plan to take it easy for the rest of today and you should NOT DRIVE or use heavy machinery  until tomorrow (because of the sedation medicines used during the test).    FOLLOW UP: Our staff will call the number listed on your records 48-72 hours following your procedure to check on you and address any questions or concerns that you may have regarding the information given to you following your procedure. If we do not reach you, we will leave a message.  We will attempt to reach you two times.  During this call, we will ask if you have developed any symptoms of COVID 19. If you develop any symptoms (ie: fever, flu-like symptoms, shortness of breath, cough etc.) before then, please call [phone].  If you test positive for Covid 19 in the 2 weeks post procedure, please call and report this information to us.    If any biopsies were taken you will be contacted by phone or by letter within the next 1-3 weeks.  Please call us at [phone] if you have not heard about the biopsies in 3 weeks.    SIGNATURES/CONFIDENTIALITY: You and/or your care partner have signed paperwork which will be entered into your electronic medical record.  These signatures attest to the fact that that the information above on your After Visit Summary has been reviewed and is understood.  Full responsibility of the confidentiality of this discharge information lies with you and/or your care-partner. 

## 2020-08-28 NOTE — Progress Notes (Signed)
Vitals-CW  History reviewed. 

## 2020-09-01 ENCOUNTER — Telehealth: Payer: Self-pay

## 2020-09-01 NOTE — Telephone Encounter (Signed)
  Follow up Call-  Call back number 08/28/2020  Post procedure Call Back phone  # [phone]  Permission to leave phone message Yes  Some recent data might be hidden     Patient questions:  Do you have a fever, pain , or abdominal swelling? No. Pain Score  0 *  Have you tolerated food without any problems? Yes.    Have you been able to return to your normal activities? Yes.    Do you have any questions about your discharge instructions: Diet   No. Medications  No. Follow up visit  No.  Do you have questions or concerns about your Care? No.  Actions: * If pain score is 4 or above: No action needed, pain <4.  1. Have you developed a fever since your procedure? no  2.   Have you had an respiratory symptoms (SOB or cough) since your procedure? no  3.   Have you tested positive for COVID 19 since your procedure no  4.   Have you had any family members/close contacts diagnosed with the COVID 19 since your procedure?  no   If yes to any of these questions please route to Laverna Peace, RN and Karlton Lemon, RN

## 2020-09-04 ENCOUNTER — Other Ambulatory Visit: Payer: Self-pay

## 2020-09-04 DIAGNOSIS — K200 Eosinophilic esophagitis: Secondary | ICD-10-CM

## 2020-09-24 ENCOUNTER — Other Ambulatory Visit: Payer: Self-pay

## 2020-09-24 ENCOUNTER — Ambulatory Visit: Payer: Managed Care, Other (non HMO) | Admitting: Allergy

## 2020-09-24 ENCOUNTER — Telehealth: Payer: Self-pay | Admitting: Gastroenterology

## 2020-09-24 ENCOUNTER — Encounter: Payer: Self-pay | Admitting: Allergy

## 2020-09-24 VITALS — BP 128/88 | HR 71 | Temp 98.0°F | Resp 16 | Ht 70.28 in | Wt 233.0 lb

## 2020-09-24 DIAGNOSIS — K200 Eosinophilic esophagitis: Secondary | ICD-10-CM

## 2020-09-24 DIAGNOSIS — T781XXD Other adverse food reactions, not elsewhere classified, subsequent encounter: Secondary | ICD-10-CM | POA: Diagnosis not present

## 2020-09-24 DIAGNOSIS — T781XXA Other adverse food reactions, not elsewhere classified, initial encounter: Secondary | ICD-10-CM | POA: Insufficient documentation

## 2020-09-24 MED ORDER — OMEPRAZOLE 40 MG PO CPDR: 40.0000 mg | DELAYED_RELEASE_CAPSULE | Freq: Two times a day (BID) | ORAL | 3 refills | Status: DC

## 2020-09-24 NOTE — Telephone Encounter (Signed)
Rx for omeprazole sent to pharmacy as requested.  

## 2020-09-24 NOTE — Patient Instructions (Addendum)
Today's skin testing showed: Borderline to corn. Results given.   EoE: Continue with omeprazole 40mg  twice a day as prescribed by Dr. . Keep follow up with GI. Read handout on EoE. I doubt corn is a major trigger for you. Based on your dietary history - consider dairy and/or gluten elimination diet.   Follow up in 12 months or sooner if needed.

## 2020-09-24 NOTE — Assessment & Plan Note (Signed)
Diagnosed with eosinophilic esophagitis 5 years ago after an EGD requiring dilatation.  Patient was having dysphagia with various foods but mainly meats.  Symptoms subsided until 6 months ago and noted difficulty swallowing meats.  Recent EGD on 07/22/2020 showed elevated eosinophils of greater than 25 per hpf.  Started on omeprazole 40 mg twice a day with some benefit.  Consumes dairy, eggs and wheat on a daily basis.  Concerned for food allergy triggers.  Today's skin testing showed: Borderline to corn only. Results given.   Discussed with patient that EoE is a T cell mediated process and skin prick testing does not necessarily identity EoE food triggers. However, studies have shown that milk, gluten, eggs and peanuts/tree nuts are major EoE triggers.   Given above skin testing results, I'm not sure how much corn is a trigger for him as he does not consume it on a daily basis.  Based on his dietary history, recommend dairy and/or gluten elimination diet if still symptomatic. Ideally EGD with biopsy should be obtained after food elimination diet to monitor progress of eosinophils as clinical symptoms do not necessarily correlate with pathology.   Gave handout on eosinophilic esophagitis.  Continue with omeprazole 40mg  twice a day as prescribed by Dr. .  Keep follow up with GI.

## 2020-09-24 NOTE — Progress Notes (Signed)
New Patient Note  RE: Drew Cameron MRN: [id number] DOB: 10-Sep-1981 Date of Office Visit: 09/24/2020  Referring provider: Rachael FeeJacobs, Daniel P, MD Primary care provider: Lovenia KimHepler, Mark, PA-C  Chief Complaint: Allergy Testing (Food)  History of Present Illness: I had the pleasure of seeing Drew HammansWilliam Boland Cameron for initial evaluation at the [facility] on 09/24/2020. He is a 39 y.o. male, who is referred here by Dr. Christella HartiganJacobs (GI) for the evaluation of eosinophilic esophagitis.  Patient was diagnosed with EoE about 5 years ago. He had EGD with dilatations and biopsy which were consistent with EoE. He choked on pork chops while eating lunch at work and had to go to the ER.  Even before the above incident he had issues with choking on foods. Trigger foods include beef, chicken and pork.  Patient was doing well up until 6 months ago and noted similar symptoms as above after eating a chicken sandwich at [employer]. Currently on omeprazole 40mg  twice a day with some benefit but he still has a sensation in his esophagus. No additional choking episodes. Noted worsening symptoms when only taking omeprazole once a day. No prior food elimination diets or swallow steroids.   Last EGD on 07/22/20 did not require dilatation but biopsy showed elevated eosinophils.   Dietary History: patient has been eating other foods including daily milk, about daily eggs, frequently eating peanut and treenuts, rarely sesame, sometimes shellfish, fish, rarely soy, daily wheat, meats, fruits and vegetables.  07/22/2020 GI visit: "HPI: This is a very pleasant 39 year old man whom I last saw about 5 years ago  I last saw him about 5 years ago, see those interactions summarized above.  After the EGD and dilation 5 years ago he had 0 dysphagia for at least 4-1/2 years.  Starting 2 or 3 months ago he began to have some dysphagia again.  He had an overt food impaction with chicken sandwich that lasted for  about 3 hours.  He came very close to going to the emergency room.  He has also had minor intermittent reflux.  His weight has fluctuated he joined weight watchers about a year ago and lost 40 pounds but then he was diagnosed with Covid March 2021 and gained a lot of weight since then.  He has been very careful about chewing his food very well and as long as he is careful he does not have dysphagia.  Liquids do not cause troubles at all."  08/28/2020 EGD biopsy results:   Assessment and Plan: Drew Cameron is a 39 y.o. male with: Eosinophilic esophagitis Diagnosed with eosinophilic esophagitis 5 years ago after an EGD requiring dilatation.  Patient was having dysphagia with various foods but mainly meats.  Symptoms subsided until 6 months ago and noted difficulty swallowing meats.  Recent EGD on 07/22/2020 showed elevated eosinophils of greater than 25 per hpf.  Started on omeprazole 40 mg twice a day with some benefit.  Consumes dairy, eggs and wheat on a daily basis.  Concerned for food allergy triggers.  Today's skin testing showed: Borderline to corn only. Results given.   Discussed with patient that EoE is a T cell mediated process and skin prick testing does not necessarily identity EoE food triggers. However, studies have shown that milk, gluten, eggs and peanuts/tree nuts are major EoE triggers.   Given above skin testing results, I'm not sure how much corn is a trigger for him as he does not consume it on a daily basis.  Based on his dietary history, recommend dairy and/or gluten elimination diet if still symptomatic. Ideally EGD with biopsy should be obtained after food elimination diet to monitor progress of eosinophils as clinical symptoms do not necessarily correlate with pathology.   Gave handout on eosinophilic esophagitis.  Continue with omeprazole 40mg  twice a day as prescribed by Dr. .  Keep follow up with GI.  Return in about 1 year (around 09/24/2021).  Other allergy  screening: Asthma: no Rhino conjunctivitis: yes  Mild rhinitis symptoms during seasonal changes and takes OTC antihistamines with some benefit.  Medication allergy: no Hymenoptera allergy: no Urticaria: no Eczema:no History of recurrent infections suggestive of immunodeficency: no  Diagnostics: Skin Testing: Food allergy panel. Positive test to: borderline to corn.  Results discussed with patient/family.  Food Adult Perc - 09/24/20 1100    Time Antigen Placed 1118    Allergen Manufacturer 09/26/20    Location Back    Number of allergen test 74    Panel 2 Select     Control-buffer 50% Glycerol Negative    Control-Histamine 1 mg/ml 2+    1. Peanut Negative    2. Soybean Negative    3. Wheat Negative    4. Sesame Negative    5. Milk, cow Negative    6. Egg White, Chicken Negative    7. Casein Negative    8. Shellfish Mix Negative    9. Fish Mix Negative    10. Cashew Negative    11. Pecan Food Negative    12. Walnut Food Negative    13. Almond Negative    14. Hazelnut Negative    15. Waynette Buttery nut Negative    16. Coconut Negative    17. Pistachio Negative    18. Catfish Negative    19. Bass Negative    20. Trout Negative    21. Tuna Negative    22. Salmon Negative    23. Flounder Negative    24. Codfish Negative    25. Shrimp Negative    26. Crab Negative    27. Lobster Negative    28. Oyster Negative    29. Scallops Negative    30. Barley Negative    31. Oat  Negative    32. Rye  Negative    33. Hops Negative    34. Rice Negative    35. Cottonseed Negative    36. Saccharomyces Cerevisiae  Negative    37. Pork Negative    38. [place] Meat Negative    39. Chicken Meat Negative    40. Beef Negative    41. Lamb Negative    42. Tomato Negative    43. White Potato Negative    44. Sweet Potato Negative    45. Pea, Green/English Negative    46. Navy Bean Negative    47. Mushrooms Negative    48. Avocado Negative    49. Onion Negative    50. Cabbage Negative     51. Carrots Negative    52. Celery Negative    53. Corn --   +/-   54. Cucumber Negative    55. Grape (White seedless) Negative    56. Orange  Negative    57. Banana Negative    58. Apple Negative    59. Peach Negative    60. Strawberry Negative    61. Cantaloupe Negative    62. Watermelon Negative    63. Pineapple Negative    64. Chocolate/Cacao bean Negative    65.  Karaya Gum Negative    66. Acacia (Arabic Gum) Negative    67. Cinnamon Negative    68. Nutmeg Negative    69. Ginger Negative    70. Garlic Negative    71. Pepper, black Negative    72. Mustard Negative           Past Medical History: Patient Active Problem List   Diagnosis Date Noted  . Eosinophilic esophagitis 09/24/2020  . Adverse food reaction 09/24/2020  . Food impaction of esophagus    Past Medical History:  Diagnosis Date  . Esophageal stricture    Past Surgical History: Past Surgical History:  Procedure Laterality Date  . ANKLE ARTHROPLASTY    . ESOPHAGOGASTRODUODENOSCOPY N/A 06/13/2015   Procedure: ESOPHAGOGASTRODUODENOSCOPY (EGD);  Surgeon: Rachael Fee, MD;  Location: [facility];  Service: Endoscopy;  Laterality: N/A;  . UPPER GASTROINTESTINAL ENDOSCOPY     Medication List:  Current Outpatient Medications  Medication Sig Dispense Refill  . ibuprofen (ADVIL) 600 MG tablet Take 1 tablet (600 mg total) by mouth every 6 (six) hours as needed. 30 tablet 0  . Multiple Vitamin (MULTIVITAMIN) tablet Take 1 tablet by mouth daily. Men's    . Omega-3 Fatty Acids (FISH OIL) 1000 MG CAPS Take 1 capsule by mouth daily.     Marland Kitchen omeprazole (PRILOSEC) 40 MG capsule Take 1 capsule (40 mg total) by mouth 2 (two) times daily. 60 capsule 3   No current facility-administered medications for this visit.   Allergies: No Known Allergies Social History: Social History   Socioeconomic History  . Marital status: Married    Spouse name: Not on file  . Number of children: Not on file  . Years of education:  Not on file  . Highest education level: Not on file  Occupational History  . Not on file  Tobacco Use  . Smoking status: Former Smoker    Years: 10.00  . Smokeless tobacco: Current User    Types: Chew  Vaping Use  . Vaping Use: Never used  Substance and Sexual Activity  . Alcohol use: Yes    Alcohol/week: 0.0 standard drinks    Comment: a few times yearly  . Drug use: No  . Sexual activity: Not on file  Other Topics Concern  . Not on file  Social History Narrative  . Not on file   Social Determinants of Health   Financial Resource Strain:   . Difficulty of Paying Living Expenses: Not on file  Food Insecurity:   . Worried About Programme researcher, broadcasting/film/video in the Last Year: Not on file  . Ran Out of Food in the Last Year: Not on file  Transportation Needs:   . Lack of Transportation (Medical): Not on file  . Lack of Transportation (Non-Medical): Not on file  Physical Activity:   . Days of Exercise per Week: Not on file  . Minutes of Exercise per Session: Not on file  Stress:   . Feeling of Stress : Not on file  Social Connections:   . Frequency of Communication with Friends and Family: Not on file  . Frequency of Social Gatherings with Friends and Family: Not on file  . Attends Religious Services: Not on file  . Active Member of Clubs or Organizations: Not on file  . Attends Banker Meetings: Not on file  . Marital Status: Not on file   Lives in a house which is 10 years. Smoking: no Occupation: Ecologist HistorySurveyor, minerals in  the house: no Carpet in the family room: yes Carpet in the bedroom: yes Heating: electric Cooling: heat pump Pet: outdoor dog, goat, chicken  Family History: Family History  Problem Relation Age of Onset  . Hiatal hernia Father   . Heart attack Father   . Heart disease Father   . Asthma Father   . Prostate cancer Maternal Grandfather   . Colon cancer Maternal Grandfather   . Lung cancer Maternal  Grandfather   . Pancreatic cancer Maternal Grandfather   . Diabetes Mother   . Colon polyps Neg Hx   . Stomach cancer Neg Hx   . Esophageal cancer Neg Hx   . Liver cancer Neg Hx   . Rectal cancer Neg Hx   . Urticaria Neg Hx   . Eczema Neg Hx   . Allergic rhinitis Neg Hx    Problem                               Relation EoE    No  Review of Systems  Constitutional: Negative for appetite change, chills, fever and unexpected weight change.  HENT: Negative for congestion and rhinorrhea.   Eyes: Negative for itching.  Respiratory: Negative for cough, chest tightness, shortness of breath and wheezing.   Cardiovascular: Negative for chest pain.  Gastrointestinal: Negative for abdominal pain.  Genitourinary: Negative for difficulty urinating.  Skin: Negative for rash.  Neurological: Negative for headaches.   Objective: BP 128/88 (BP Location: Right Arm, Patient Position: Sitting, Cuff Size: Normal)   Pulse 71   Temp 98 F (36.7 C) (Temporal)   Resp 16   Ht 5' 10.28" (1.785 m)   Wt 233 lb (105.7 kg)   SpO2 97%   BMI 33.17 kg/m  Body mass index is 33.17 kg/m. Physical Exam Vitals and nursing note reviewed.  Constitutional:      Appearance: Normal appearance. He is well-developed.  HENT:     Head: Normocephalic and atraumatic.     Right Ear: External ear normal. There is impacted cerumen.     Left Ear: External ear normal.     Nose: Nose normal.     Mouth/Throat:     Mouth: Mucous membranes are moist.     Pharynx: Oropharynx is clear.  Eyes:     Conjunctiva/sclera: Conjunctivae normal.  Cardiovascular:     Rate and Rhythm: Normal rate and regular rhythm.     Heart sounds: Normal heart sounds. No murmur heard.  No friction rub. No gallop.   Pulmonary:     Effort: Pulmonary effort is normal.     Breath sounds: Normal breath sounds. No wheezing, rhonchi or rales.  Abdominal:     Palpations: Abdomen is soft.  Musculoskeletal:     Cervical back: Neck supple.  Skin:     General: Skin is warm.     Findings: No rash.  Neurological:     Mental Status: He is alert and oriented to person, place, and time.  Psychiatric:        Behavior: Behavior normal.    The plan was reviewed with the patient/family, and all questions/concerned were addressed.  It was my pleasure to see Feliciano today and participate in his care. Please feel free to contact me with any questions or concerns.  Sincerely,  Wyline Mood, DO Allergy & Immunology  [facility] office: [phone] [facility] office: [phone]

## 2020-09-25 NOTE — Telephone Encounter (Signed)
Patient's insurance will not cover Omeprazole 40mg  one capsule BID.  Patient had to pay out-of-pocket for medications.  Insurance is requiring prior auth to be completed, but patient has never tried and failed any other medications so it will not be approved.   Please advise

## 2020-09-28 ENCOUNTER — Other Ambulatory Visit: Payer: Managed Care, Other (non HMO)

## 2020-09-28 ENCOUNTER — Encounter: Payer: Self-pay | Admitting: Physician Assistant

## 2020-09-28 ENCOUNTER — Ambulatory Visit: Payer: Managed Care, Other (non HMO) | Admitting: Physician Assistant

## 2020-09-28 VITALS — BP 130/100 | HR 76 | Ht 69.0 in | Wt 240.4 lb

## 2020-09-28 DIAGNOSIS — K200 Eosinophilic esophagitis: Secondary | ICD-10-CM

## 2020-09-28 MED ORDER — FLUTICASONE PROPIONATE HFA 220 MCG/ACT IN AERO: INHALATION_SPRAY | RESPIRATORY_TRACT | 2 refills | Status: DC

## 2020-09-28 NOTE — Progress Notes (Signed)
Subjective:    Patient ID: Drew Cameron, male    DOB: Sep 14, 1981, 39 y.o.   MRN: [id number]  HPI Drew Cameron is a pleasant 39 year old white male, established with Dr. Christella Hartigan who comes in today for follow-up of eosinophilic esophagitis. Patient had prior diagnosis of eosinophilic esophagitis in 2016. He underwent recent EGD on 08/28/2020 for symptoms of GERD and dysphagia. He was found to have mucosal changes consistent with eosinophilic esophagitis with longitudinal furrows and thickened folds in the entire esophagus. Path continue to show elevated eosinophils. He has been started on twice daily omeprazole which she has now been taking over the past month. He had also been referred to allergy for food allergy testing. He says he has completed 30 food allergy testing and was told that he was mildly positive only for corn. He has just obtained a list of all of the numerous food products containing corn corn syrup etc. He says that heartburn and indigestion is better on the omeprazole but he continues to have a sensation of dysphagia and a knot-like sensation while food is traversing his esophagus. He gets some sensation that food may stop in his esophagus or hang up especially with meats though he has not had any episodes requiring regurgitation. He mentions that the allergist suggested a food elimination diet if he is having persistent symptoms. Avoidance of dairy products and gluten was mentioned.  Review of Systems Pertinent positive and negative review of systems were noted in the above HPI section.  All other review of systems was otherwise negative.  Outpatient Encounter Medications as of 09/28/2020  Medication Sig  . Multiple Vitamin (MULTIVITAMIN) tablet Take 1 tablet by mouth daily. Men's  . Omega-3 Fatty Acids (FISH OIL) 1000 MG CAPS Take 1 capsule by mouth daily.   Marland Kitchen omeprazole (PRILOSEC) 40 MG capsule Take 1 capsule (40 mg total) by mouth 2 (two) times daily.  . fluticasone (FLOVENT  HFA) 220 MCG/ACT inhaler Swallow 2 sprays into mouth twice daily, DO NOT eat for 30 minutes after use.  . [DISCONTINUED] ibuprofen (ADVIL) 600 MG tablet Take 1 tablet (600 mg total) by mouth every 6 (six) hours as needed.   No facility-administered encounter medications on file as of 09/28/2020.   No Known Allergies Patient Active Problem List   Diagnosis Date Noted  . Eosinophilic esophagitis 09/24/2020  . Adverse food reaction 09/24/2020  . Food impaction of esophagus    Social History   Socioeconomic History  . Marital status: Married    Spouse name: Not on file  . Number of children: Not on file  . Years of education: Not on file  . Highest education level: Not on file  Occupational History  . Not on file  Tobacco Use  . Smoking status: Former Smoker    Years: 10.00  . Smokeless tobacco: Current User    Types: Chew  Vaping Use  . Vaping Use: Never used  Substance and Sexual Activity  . Alcohol use: Yes    Alcohol/week: 0.0 standard drinks    Comment: a few times yearly  . Drug use: No  . Sexual activity: Not on file  Other Topics Concern  . Not on file  Social History Narrative  . Not on file   Social Determinants of Health   Financial Resource Strain:   . Difficulty of Paying Living Expenses: Not on file  Food Insecurity:   . Worried About Programme researcher, broadcasting/film/video in the Last Year: Not on file  .  Ran Out of Food in the Last Year: Not on file  Transportation Needs:   . Lack of Transportation (Medical): Not on file  . Lack of Transportation (Non-Medical): Not on file  Physical Activity:   . Days of Exercise per Week: Not on file  . Minutes of Exercise per Session: Not on file  Stress:   . Feeling of Stress : Not on file  Social Connections:   . Frequency of Communication with Friends and Family: Not on file  . Frequency of Social Gatherings with Friends and Family: Not on file  . Attends Religious Services: Not on file  . Active Member of Clubs or  Organizations: Not on file  . Attends Banker Meetings: Not on file  . Marital Status: Not on file  Intimate Partner Violence:   . Fear of Current or Ex-Partner: Not on file  . Emotionally Abused: Not on file  . Physically Abused: Not on file  . Sexually Abused: Not on file    Drew Cameron's family history includes Asthma in his father; Colon cancer in his maternal grandfather; Diabetes in his mother; Heart attack in his father; Heart disease in his father; Hiatal hernia in his father; Lung cancer in his maternal grandfather; Pancreatic cancer in his maternal grandfather; Prostate cancer in his maternal grandfather.      Objective:    Vitals:   09/28/20 0915  BP: (!) 130/100  Pulse: 76    Physical Exam Well-developed well-nourished WM in no acute distress.  Height, Weight, 240 BMI 35.5  HEENT; nontraumatic normocephalic, EOMI, PE R LA, sclera anicteric.  Neuro/Psych; alert and oriented x4, grossly nonfocal mood and affect appropriate       Assessment & Plan:   #58 39 year old white male with eosinophilic esophagitis and GERD.  Recent EGD 08/28/2020 with elevated eosinophils and mucosal changes consistent with eosinophilic esophagitis. He has had improvement in GERD symptoms on twice daily omeprazole but continues to have dysphagia sensation, worse with meats.  #2 Food allergy testing mildly positive for corn/corn products  Plan; continue omeprazole 40 mg p.o. twice daily. This is in the process of being authorized as his insurance was not wanting to cover twice daily dosing.  We will add fluticasone spray 220 mcg 2 sprays twice daily into the mouth to be swallowed, and no eating or drinking for 30 minutes after each dose. We will plan a 6 to 8-week course.  Check TTG and IgA Avoidance of corn and corn products If he does not have good response to above, then at time of follow-up can discuss further food elimination process probably starting with avoidance of  dairy. Patient will follow up with Dr. Christella Hartigan or myself in about 8 weeks.  Drew Cameron [name] PA-C 09/28/2020   Cc: Lovenia Kim, PA-C

## 2020-09-28 NOTE — Telephone Encounter (Signed)
Pt would like to inform Joni Reining he received her message and he appreciates her help.

## 2020-09-28 NOTE — Patient Instructions (Addendum)
If you are age 40 or older, your body mass index should be between 23-30. Your Body mass index is 35.5 kg/m. If this is out of the aforementioned range listed, please consider follow up with your Primary Care Provider.  If you are age 65 or younger, your body mass index should be between 19-25. Your Body mass index is 35.5 kg/m. If this is out of the aformentioned range listed, please consider follow up with your Primary Care Provider.   Your provider has requested that you go to the basement level for lab work before leaving today. Press "B" on the elevator. The lab is located at the first door on the left as you exit the elevator.  Continue Omeprazole 40 mg 1 capsule twice daily  START Fluticasone Inhaler, Swallow 2 sprays twice a day. DO NOT eat for at least 30 minutes after use.  You have been scheduled to follow up with Dr. Christella Hartigan on November 27, 2020 at 9:50 am.

## 2020-09-28 NOTE — Progress Notes (Signed)
I agree with the above note, plan 

## 2020-09-28 NOTE — Telephone Encounter (Signed)
Okay, thanks.  Can you find out what other medicines he needs to try and fail?  I am happy to try them.

## 2020-09-28 NOTE — Telephone Encounter (Signed)
Spoke with Joni Reining from [place] and was able to get Omeprazole 40mg  take one capsule twice daily approved.  Case ID # approved until 09-28-2021.  Left message on patient's voicemail with information regarding prior authorization approval.  Faxed information to pharmacy. Advised patient to return call to our office with any questions or concerns.

## 2020-09-29 ENCOUNTER — Telehealth: Payer: Self-pay | Admitting: Physician Assistant

## 2020-09-29 LAB — TISSUE TRANSGLUTAMINASE, IGA: (tTG) Ab, IgA: <1 U/mL

## 2020-09-29 LAB — IGA: Immunoglobulin A: 239 mg/dL (ref 47–310)

## 2020-09-29 NOTE — Telephone Encounter (Signed)
Pt is requesting a call back from a nurse to discuss the recent results he got back from [place] regarding his lab results.

## 2020-09-30 NOTE — Telephone Encounter (Signed)
Patient has seen his lab results in his My Chart. He does not understand the results. 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 He asks if the tests indicate celiac allergy. I told them as a nurse, I do not think they do. Assured him he will get guidance from his provider soon.

## 2020-10-08 ENCOUNTER — Telehealth: Payer: Self-pay | Admitting: Gastroenterology

## 2020-10-08 NOTE — Telephone Encounter (Signed)
Patient states for the last week he felt like his blood pressure was spiking throughout the day. Patient states he has checked it for the last few mornings and it was around 146/100. Patient states he has never had high BP before and is concerned its coming from the omeprazole. Patient states he has read on a EOE forum that omeprazole can cause elevated BP. Informed patient that since he has been on omeprazole for several months that more than likely it is not coming from this medication. Informed patient he may have to seek his PCP for evauluation but would send this message to Dr. Christella Hartigan. Please advise Dr. Christella Hartigan.

## 2020-10-09 NOTE — Telephone Encounter (Signed)
Patient aware that Dr Christella Hartigan feels this is an unusual side effect of proton pump inhibitors and he would like patient to follow up with PCP regarding these issues.  Patient agreed to plan and verbalized understanding.  Patient states he will contact PCP today.  No further questions.

## 2020-10-09 NOTE — Telephone Encounter (Signed)
I agree, that is a quite unusual side effect of proton pump inhibitor.  He should consult with his primary care physician first.

## 2020-11-24 ENCOUNTER — Encounter: Payer: Self-pay | Admitting: Gastroenterology

## 2020-11-24 ENCOUNTER — Ambulatory Visit: Payer: Managed Care, Other (non HMO) | Admitting: Gastroenterology

## 2020-11-24 VITALS — BP 136/70 | HR 70 | Ht 69.0 in | Wt 230.6 lb

## 2020-11-24 DIAGNOSIS — K200 Eosinophilic esophagitis: Secondary | ICD-10-CM

## 2020-11-24 NOTE — Progress Notes (Signed)
Review of pertinent gastrointestinal problems: 1.    Eosinophilic esophagitis.  Dysphagia led to EGD 2016 Dr. Christella Hartigan.  I dilated a focal stricture at his GE junction that was clearly benign.  I felt it was either peptic versus Schatzki's ring versus eosinophilic esophagitis associated.  There is some endoscopic findings suggestive of eosinophilic esophagitis; linear furrows and circumferential rings.  Biopsies were taken from distal and proximal esophagus which were consistent with eosinophilic esophagitis.  I recommended repeat EGD while on twice daily PPI.    EGD October 2021 while on PPI 1 month showed endoscopic evidence of EOE, biopsies from the esophagus again showed elevated eosinophils.  He was referred to allergy, felt to possibly have corn, corn product related allergies.  Trial of swallowed fluticasone November 2021.   HPI: This is a very pleasant 40 year old man whom I last saw at the time of upper endoscopy about 3-1/2 months ago.  He took swallowed fluticasone 2 puffs twice daily for about 1 month.  He has also been avoiding corn and corn products.  His swallowing is completely back to normal.  He is not bothered at all by his swallowing.  He was taking his omeprazole twice daily for a brief time and then noticed his blood pressure was much higher than usual.  He read on some eosinophilic esophagitis websites that the omeprazole might be causing his elevated blood pressure and so he cut back to once a day and then only as needed and his blood pressure has returned to its normal range.   ROS: complete GI ROS as described in HPI, all other review negative.  Constitutional:  No unintentional weight loss   Past Medical History:  Diagnosis Date  . Eosinophilic esophagitis   . Esophageal stricture     Past Surgical History:  Procedure Laterality Date  . ANKLE ARTHROPLASTY    . ESOPHAGOGASTRODUODENOSCOPY N/A 06/13/2015   Procedure: ESOPHAGOGASTRODUODENOSCOPY (EGD);  Surgeon: Rachael Fee, MD;  Location: [facility];  Service: Endoscopy;  Laterality: N/A;  . UPPER GASTROINTESTINAL ENDOSCOPY      Current Outpatient Medications  Medication Sig Dispense Refill  . Multiple Vitamin (MULTIVITAMIN) tablet Take 1 tablet by mouth daily. Men's    . Omega-3 Fatty Acids (FISH OIL) 1000 MG CAPS Take 1 capsule by mouth daily.     Marland Kitchen omeprazole (PRILOSEC) 40 MG capsule Take 1 capsule (40 mg total) by mouth 2 (two) times daily. (Patient taking differently: Take 40 mg by mouth daily as needed.) 60 capsule 3   No current facility-administered medications for this visit.    Allergies as of 11/24/2020  . (No Known Allergies)    Family History  Problem Relation Age of Onset  . Hiatal hernia Father   . Heart attack Father   . Heart disease Father   . Asthma Father   . Prostate cancer Maternal Grandfather   . Colon cancer Maternal Grandfather   . Lung cancer Maternal Grandfather   . Pancreatic cancer Maternal Grandfather   . Diabetes Mother   . Colon polyps Neg Hx   . Stomach cancer Neg Hx   . Esophageal cancer Neg Hx   . Liver cancer Neg Hx   . Rectal cancer Neg Hx   . Urticaria Neg Hx   . Eczema Neg Hx   . Allergic rhinitis Neg Hx     Social History   Socioeconomic History  . Marital status: Married    Spouse name: Not on file  . Number of children: Not  on file  . Years of education: Not on file  . Highest education level: Not on file  Occupational History  . Not on file  Tobacco Use  . Smoking status: Former Smoker    Years: 10.00  . Smokeless tobacco: Current User    Types: Chew  Vaping Use  . Vaping Use: Never used  Substance and Sexual Activity  . Alcohol use: Yes    Alcohol/week: 0.0 standard drinks    Comment: a few times yearly  . Drug use: No  . Sexual activity: Not on file  Other Topics Concern  . Not on file  Social History Narrative  . Not on file   Social Determinants of Health   Financial Resource Strain: Not on file  Food  Insecurity: Not on file  Transportation Needs: Not on file  Physical Activity: Not on file  Stress: Not on file  Social Connections: Not on file  Intimate Partner Violence: Not on file     Physical Exam: BP 136/70   Pulse 70   Ht 5\' 9"  (1.753 m)   Wt 230 lb 9.6 oz (104.6 kg)   SpO2 96%   BMI 34.05 kg/m  Constitutional: generally well-appearing Psychiatric: alert and oriented x3 Abdomen: soft, nontender, nondistended, no obvious ascites, no peritoneal signs, normal bowel sounds No peripheral edema noted in lower extremities  Assessment and plan: 40 y.o. male with eosinophilic esophagitis  His symptoms are completely resolved after brief 1 month course of swallowed fluticasone and also since he started avoiding corn and corn products as best that he can on the voice of an allergist.  He knows that if his dysphagia returns he should start using the fluticasone again at previous dosing regimen and call my office to report on his response.  Elevated blood pressure is certainly a pretty uncommon side effect of proton pump inhibitors but maybe it truly is the case for him.  He will take it only on as-needed basis which seems like it is once or twice weekly now.  He will follow-up on an as-needed basis.  Please see the "Patient Instructions" section for addition details about the plan.  40, MD [facility] 11/24/2020, 2:03 PM   Total time on date of encounter was 25 minutes (this included time spent preparing to see the patient reviewing records; obtaining and/or reviewing separately obtained history; performing a medically appropriate exam and/or evaluation; counseling and educating the patient and family if present; ordering medications, tests or procedures if applicable; and documenting clinical information in the health record).

## 2020-11-24 NOTE — Patient Instructions (Addendum)
If you are age 41 or younger, your body mass index should be between 19-25. Your Body mass index is 34.05 kg/m. If this is out of the aformentioned range listed, please consider follow up with your Primary Care Provider.   Please continue omeprazole as needed.  If dysphagia (trouble swallowing) restarts please use fluticasone and contact our office for an appointment.  Thank you for entrusting me with your care and choosing [facility].  Dr Christella Hartigan

## 2020-11-27 ENCOUNTER — Ambulatory Visit: Payer: Managed Care, Other (non HMO) | Admitting: Gastroenterology

## 2021-04-29 IMAGING — CT CT RENAL STONE PROTOCOL
2 of 4 series · 17 of 46 positions shown, 19 images · non-contrast
Comparison: None.

CLINICAL DATA: Left flank pain

EXAM:
CT ABDOMEN AND PELVIS WITHOUT CONTRAST
TECHNIQUE: Multidetector CT imaging of the abdomen and pelvis was performed
following the standard protocol without IV contrast.

[Series 2: axial st · axial · 0.82mm/px · z∈[-437,+3]mm · 14 of 96 slices shown, 16 images]
[im 4/96  soft-tissue]
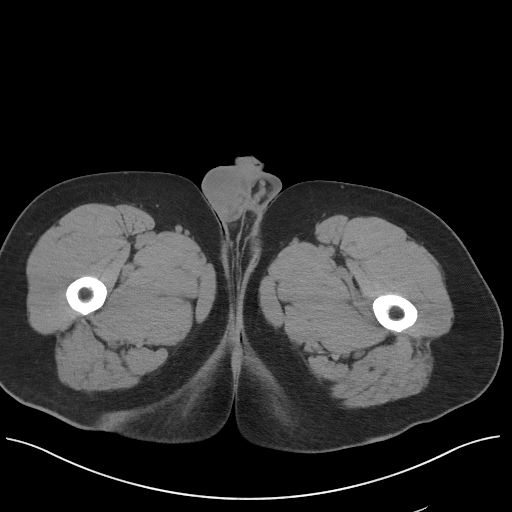
[im 4/96  bone]
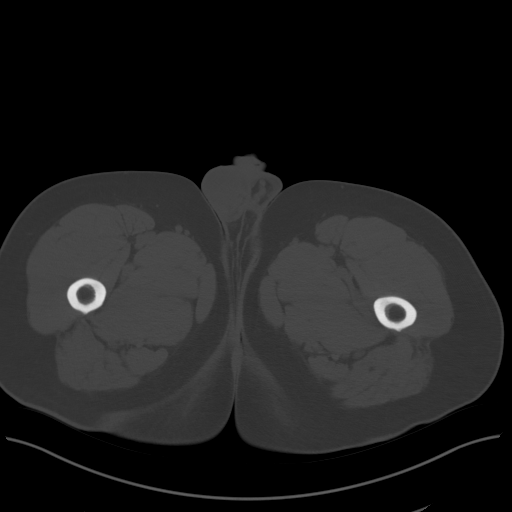
[im 11/96  soft-tissue]
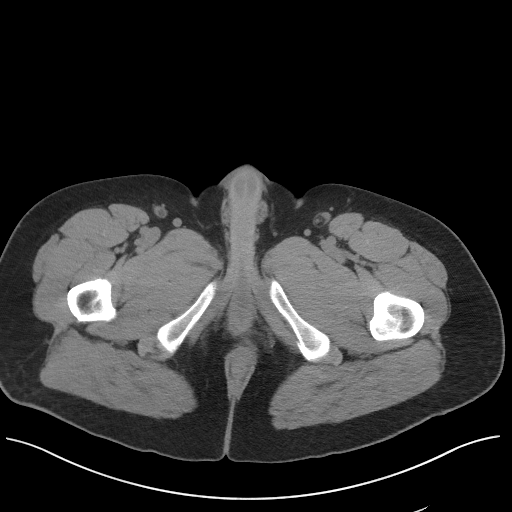
[im 19/96  soft-tissue]
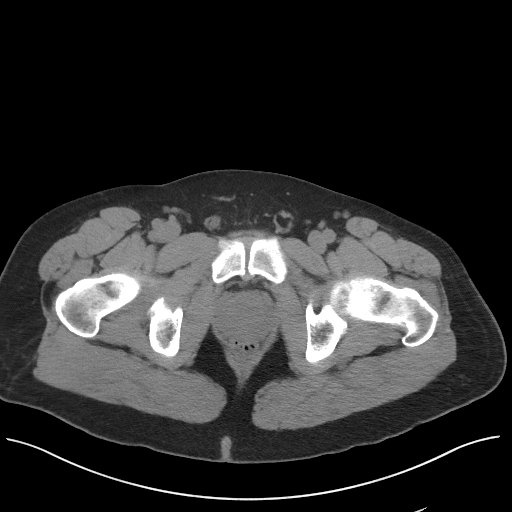
[im 26/96  soft-tissue]
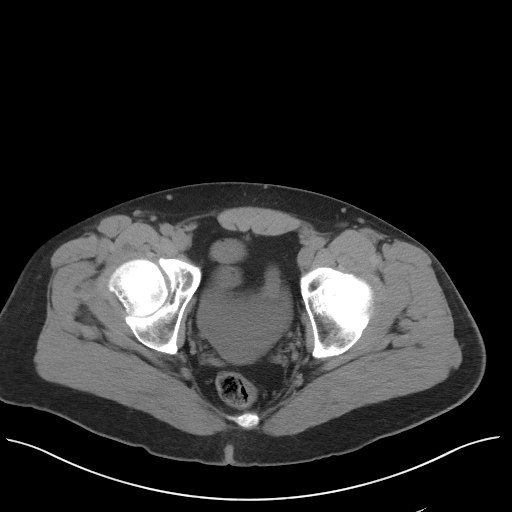
[im 33/96  soft-tissue]
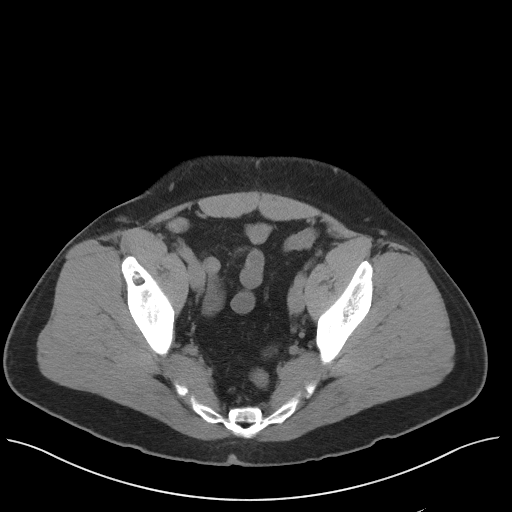
[im 37/96  soft-tissue]
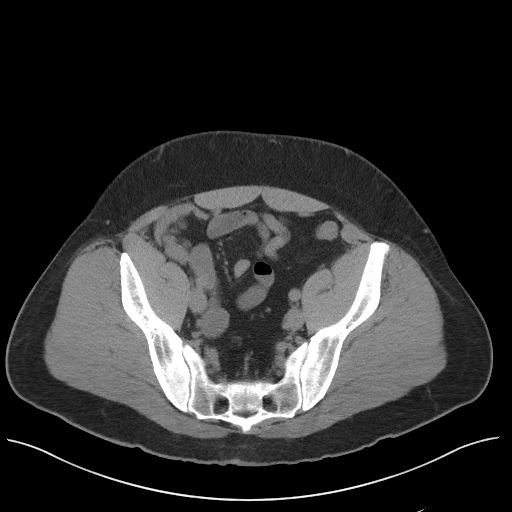
[im 44/96  soft-tissue]
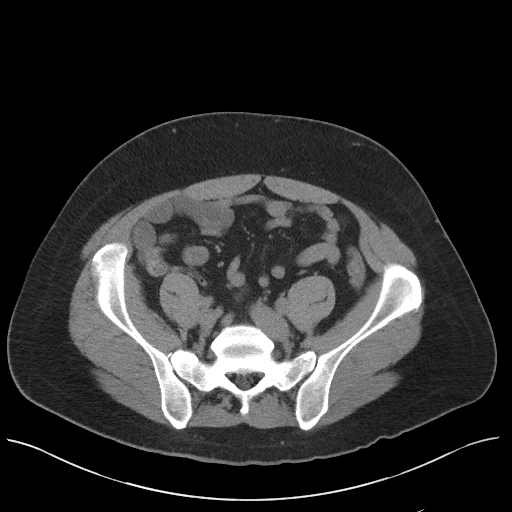
[im 52/96  soft-tissue]
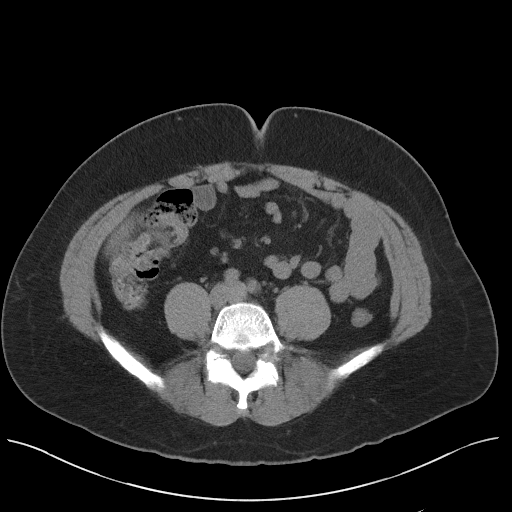
[im 59/96  soft-tissue]
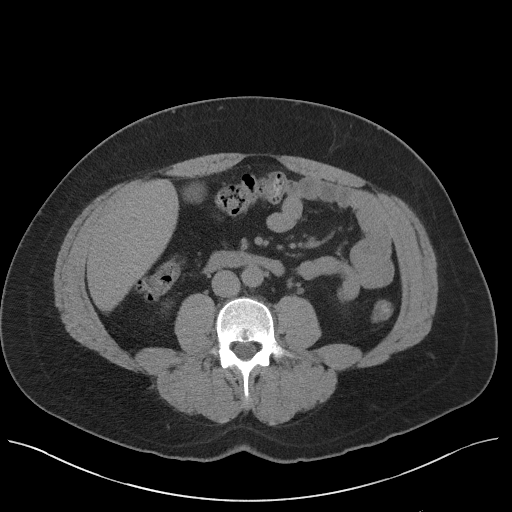
[im 59/96  bone]
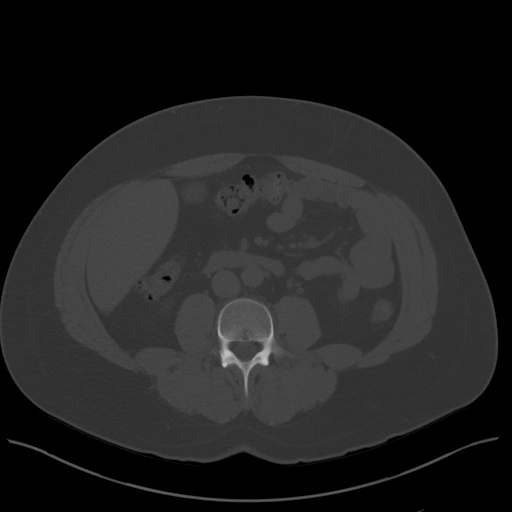
[im 63/96  soft-tissue]
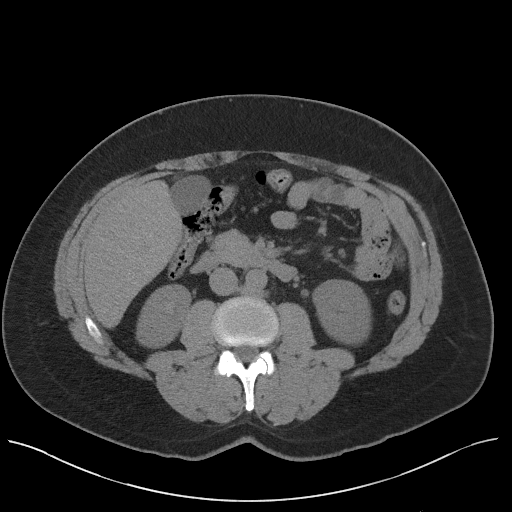
[im 70/96  soft-tissue]
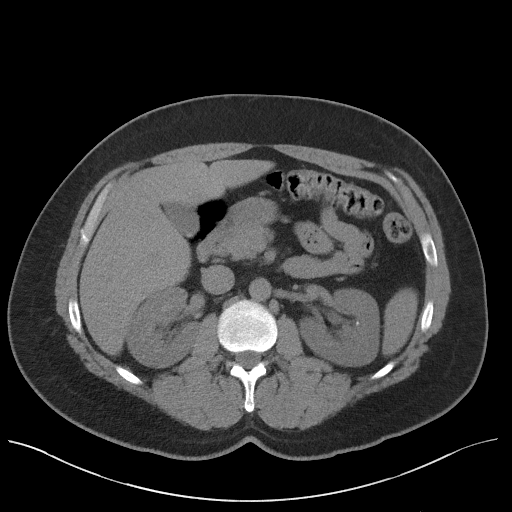
[im 77/96  soft-tissue]
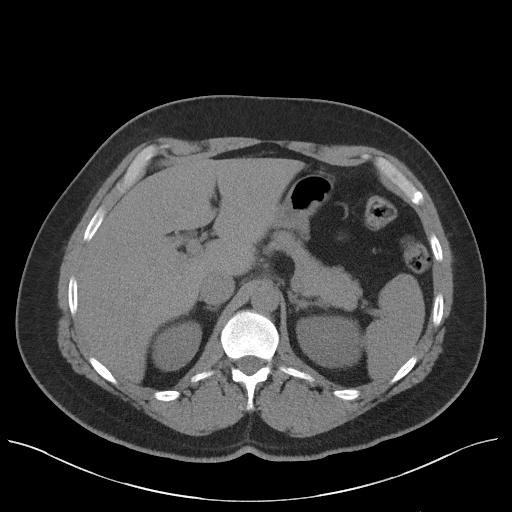
[im 85/96  soft-tissue]
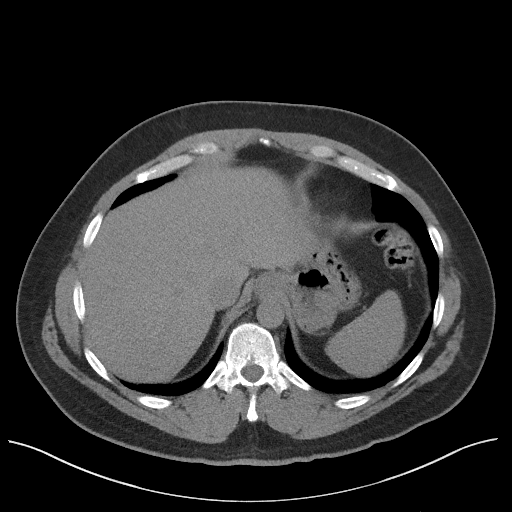
[im 92/96  soft-tissue]
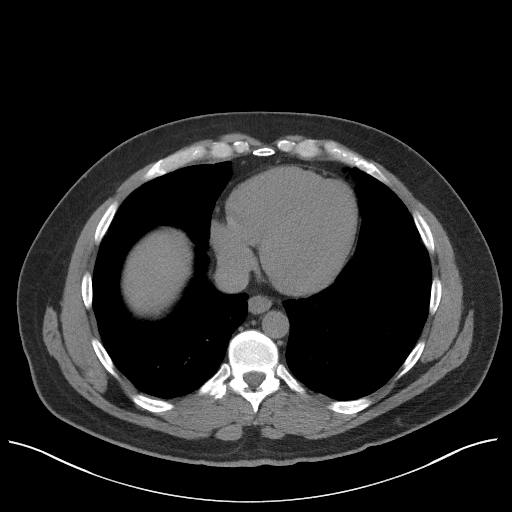

[Series 5: coronal st · coronal · 0.95mm/px · 3 of 91 slices shown]
[im 31/91  soft-tissue]
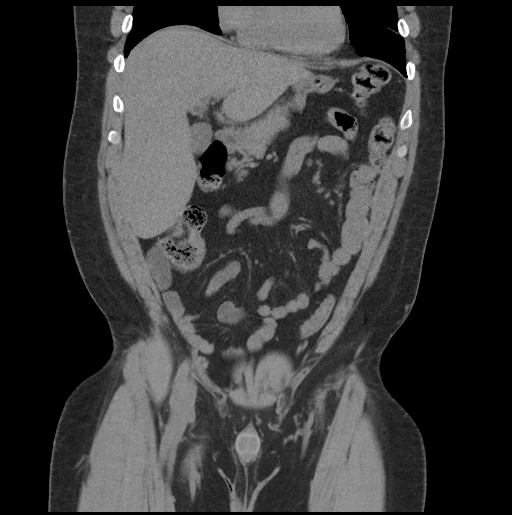
[im 41/91  soft-tissue]
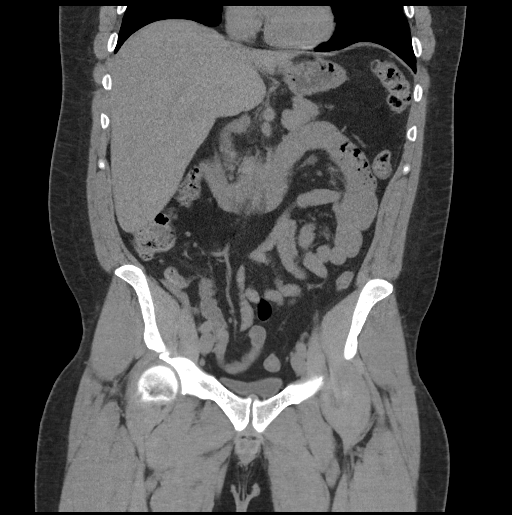
[im 51/91  soft-tissue]
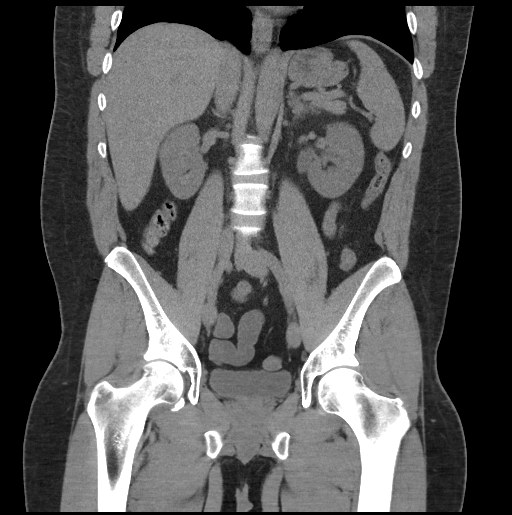

[17 of 46 positions shown; findings below may reference images not displayed]

FINDINGS: Lower chest: Lung bases are clear. No effusions. Heart is normal
size.

Hepatobiliary: Small layering gallstone within the gallbladder. No
focal hepatic abnormality.

Pancreas: No focal abnormality or ductal dilatation.

Spleen: No focal abnormality.  Normal size.

Adrenals/Urinary Tract: Punctate nonobstructing stone in the midpole
of the left kidney. Mild left hydronephrosis due to 3 mm mid left
ureteral stone. No additional ureteral stones. No hydronephrosis on
the right. Adrenal glands and urinary bladder unremarkable. 1.5 cm
low-density lesion in the midpole of the left kidney cannot be
characterized on this noncontrast study.

Stomach/Bowel: Stomach, large and small bowel grossly unremarkable.
Appendix normal.

Vascular/Lymphatic: No evidence of aneurysm or adenopathy.

Reproductive: No visible focal abnormality.

Other: No free fluid or free air.

Musculoskeletal: No acute bony abnormality. Bilateral L4 pars
defects are noted. Slight anterolisthesis of L4 on L5. Degenerative
disc disease at L4-5.
IMPRESSION: 3 mm mid left ureteral stone with mild left hydronephrosis.

Punctate nephrolithiasis in the left midpole.

1.5 cm low-density lesion in the midpole of the left kidney which
cannot be characterized on this noncontrast study. This could be
further evaluated with elective outpatient renal ultrasound if felt
clinically indicated.

Cholelithiasis.

Bilateral L4 pars defects with slight anterolisthesis. Degenerative
disc disease at L4-5.

## 2021-05-02 IMAGING — US US RENAL
1 series · 14 of 25 positions shown · non-contrast
Comparison: CT abdomen and pelvis 12/02/2019

CLINICAL DATA: Abnormal CT question LEFT renal cyst

EXAM:
RENAL / URINARY TRACT ULTRASOUND COMPLETE

[Series 1: us renal · 14 of 34 slices shown]
[im 1/34]
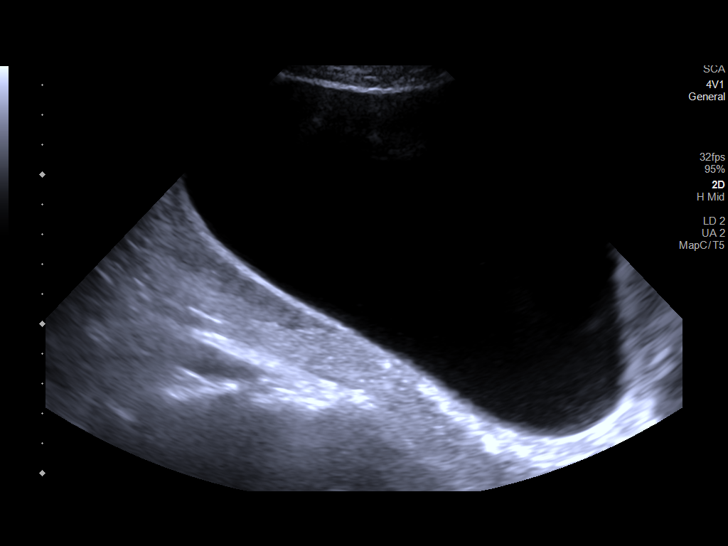
[im 3/34]
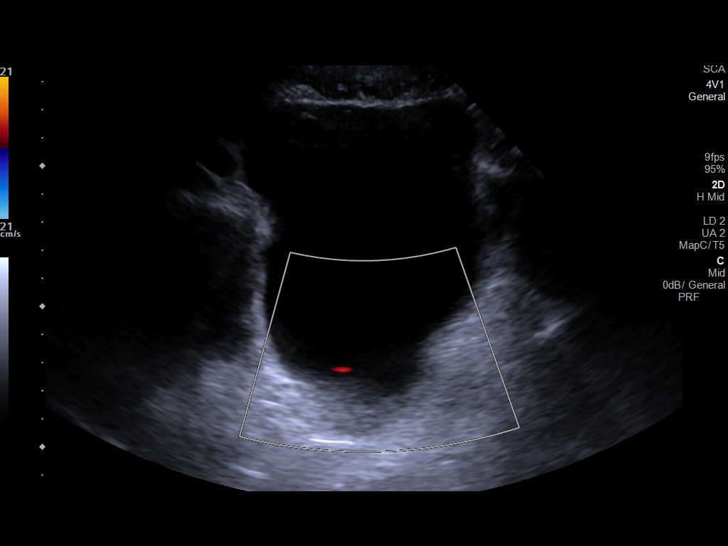
[im 6/34]
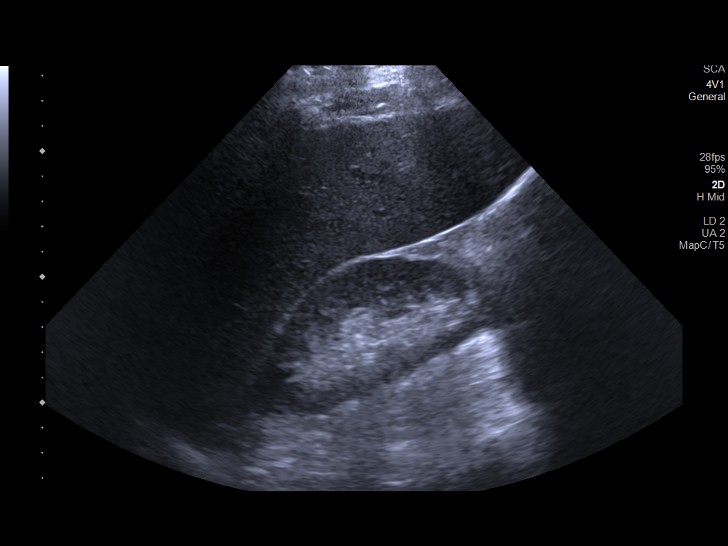
[im 9/34]
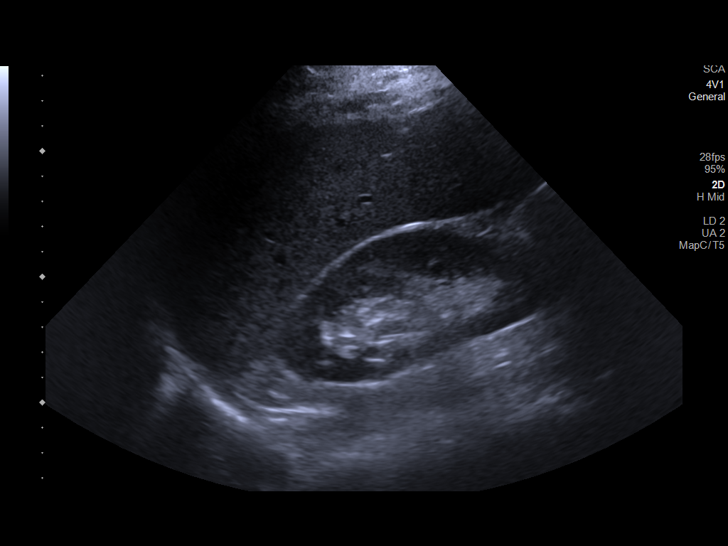
[im 12/34]
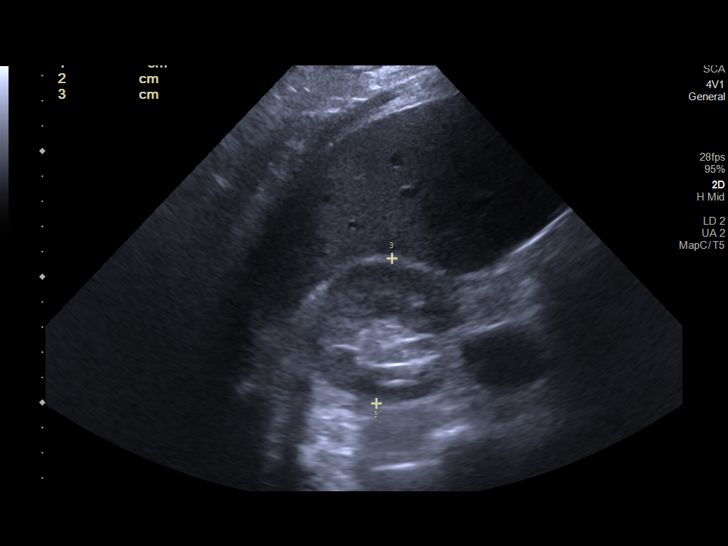
[im 13/34]
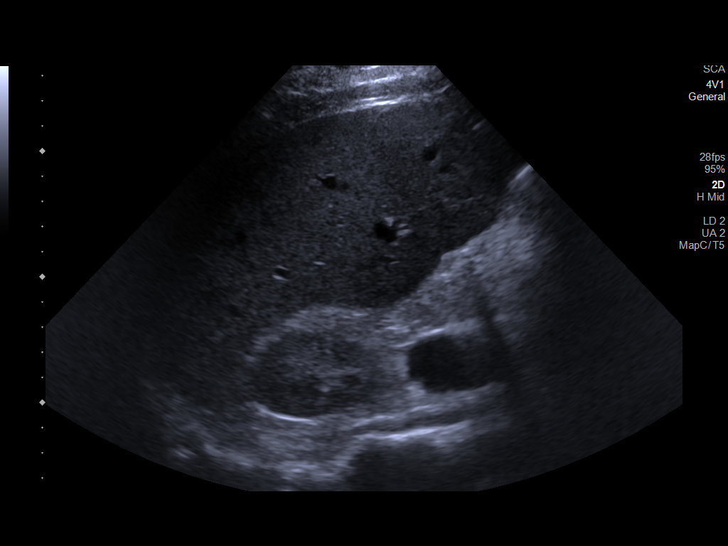
[im 16/34]
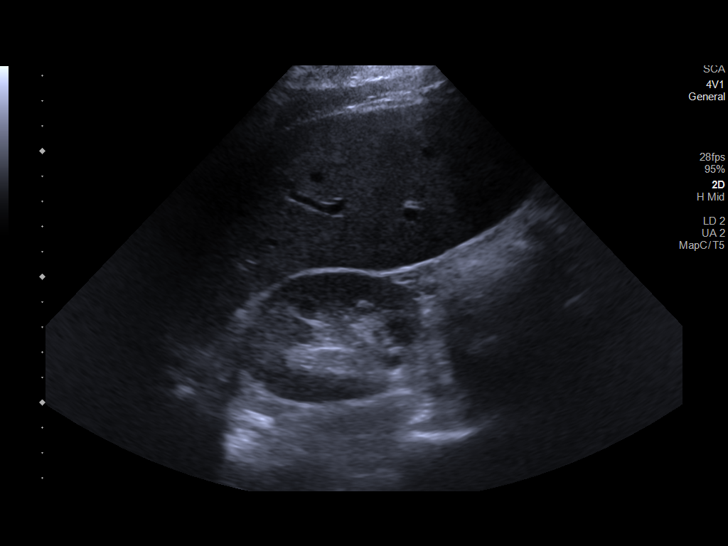
[im 18/34]
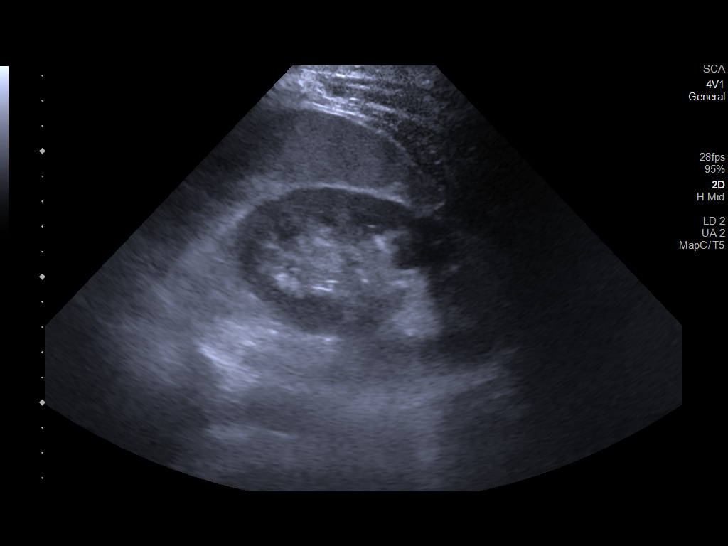
[im 21/34]
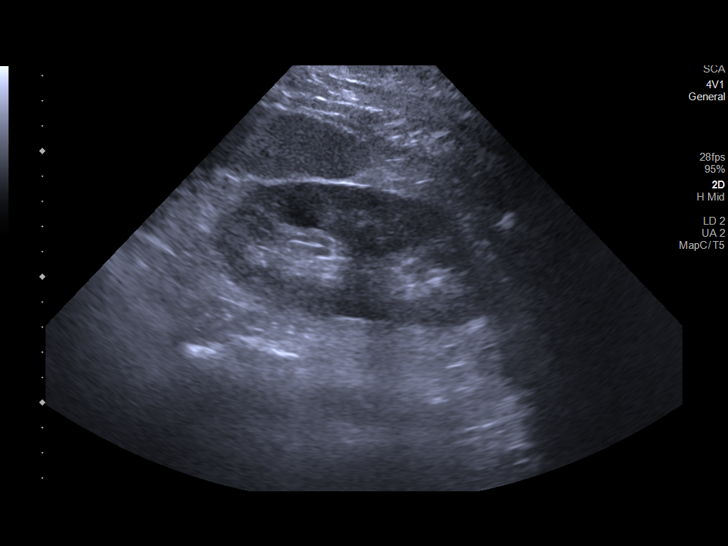
[im 23/34]
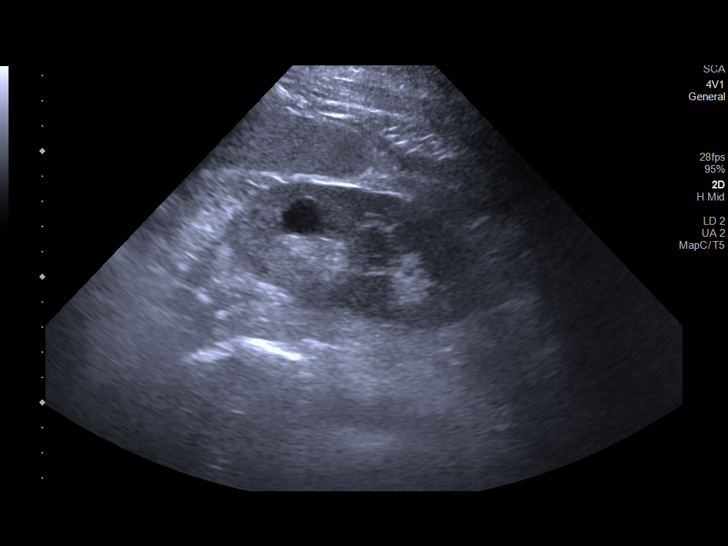
[im 25/34]
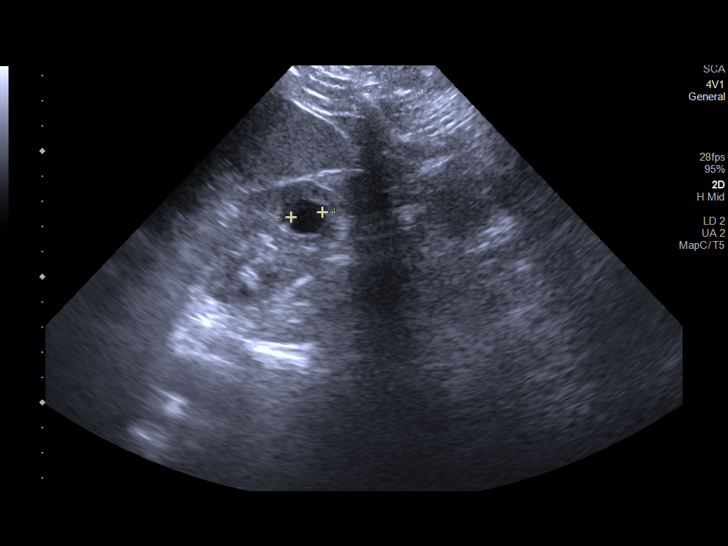
[im 28/34]
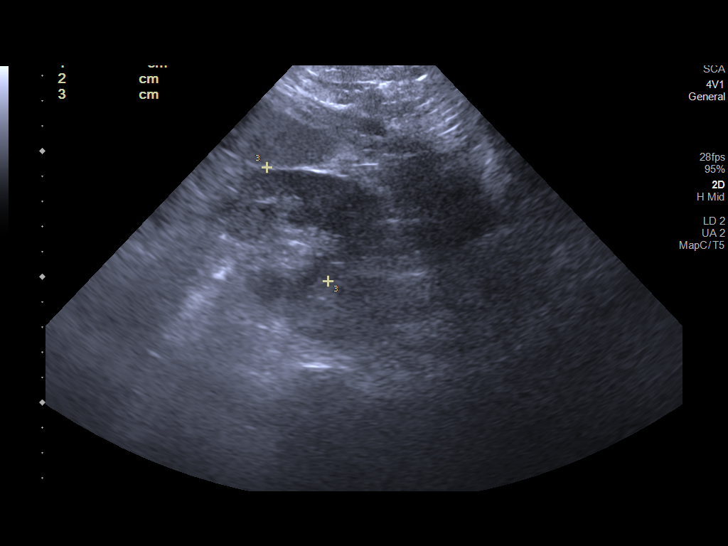
[im 31/34]
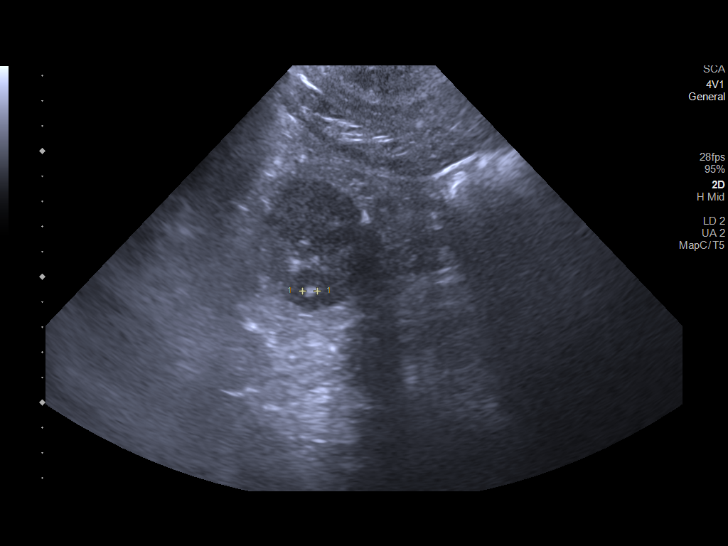
[im 34/34]
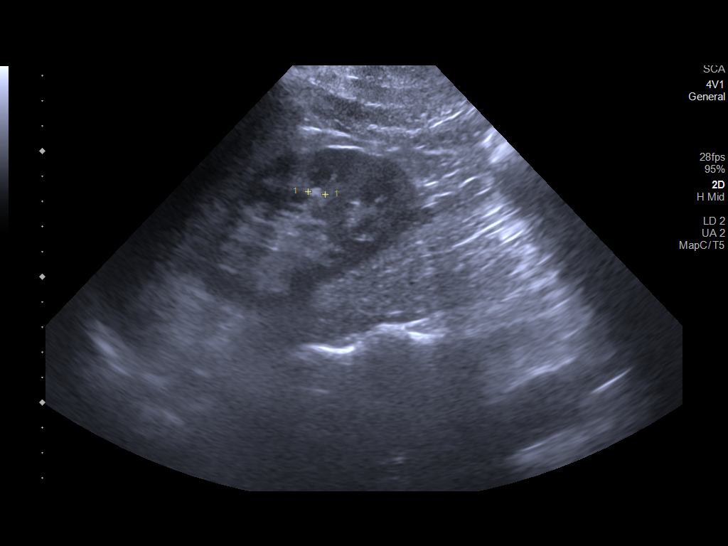

[14 of 25 positions shown; findings below may reference images not displayed]

FINDINGS: Right Kidney:

Renal measurements: 11.6 x 4.9 x 5.8 cm = volume: 174 mL. Normal
cortical thickness and echogenicity. Small extrarenal pelvis. No
mass, hydronephrosis or shadowing calcification.

Left Kidney:

Renal measurements: 11.0 x 5.9 x 5.1 cm = volume: 176 mL. Normal
cortical thickness and echogenicity. Small upper pole renal cyst 17
x 16 x 13 mm corresponding to the CT finding. Small mid LEFT renal
calculus, measures 7 mm diameter by ultrasound but was tiny
approximately 2 mm by CT. No additional masses or hydronephrosis.

Bladder:

Well distended, unremarkable.

Other:

Small extrarenal pelvis RIGHT kidney.

Small LEFT renal cyst 17 mm diameter.

Nonobstructing mid LEFT renal calculus, 2 mm diameter by prior CT,
overestimated on current ultrasound exam.

## 2021-07-05 ENCOUNTER — Other Ambulatory Visit: Payer: Self-pay

## 2021-07-05 MED ORDER — OMEPRAZOLE 40 MG PO CPDR: 40.0000 mg | DELAYED_RELEASE_CAPSULE | Freq: Every day | ORAL | 4 refills | Status: DC | PRN

## 2021-09-17 ENCOUNTER — Other Ambulatory Visit: Payer: Self-pay

## 2021-09-17 ENCOUNTER — Telehealth: Payer: Self-pay

## 2021-09-17 MED ORDER — OMEPRAZOLE 40 MG PO CPDR: 40.0000 mg | DELAYED_RELEASE_CAPSULE | Freq: Every day | ORAL | 3 refills | Status: DC | PRN

## 2021-09-17 NOTE — Telephone Encounter (Signed)
Called patient to inform him that we had received a form to complete prior authorization for Omeprazole 40mg  with CoverMyMeds.  Attempted to complete prior auth and was notified by CoverMyMeds "drug is covered by current benefit plan. No further PA activity needed."  Patient advised of the information provided by CoverMyMeds and I advised patient that Rx was sent to [facility] in [place], where medication will be $30.00 for a 90 day supply. Patient stated that he just filled his Omeprazole and will let [place] know if price is more than $30.00 when it is time for him to pick up another refill.  Patient agreed to plan and verbalized understanding.  No further questions.

## 2021-09-28 ENCOUNTER — Ambulatory Visit: Payer: Managed Care, Other (non HMO) | Admitting: Allergy

## 2022-04-26 ENCOUNTER — Other Ambulatory Visit: Payer: Self-pay | Admitting: Endocrinology

## 2022-04-26 ENCOUNTER — Other Ambulatory Visit (HOSPITAL_COMMUNITY): Payer: Self-pay | Admitting: Endocrinology

## 2022-04-26 DIAGNOSIS — E010 Iodine-deficiency related diffuse (endemic) goiter: Secondary | ICD-10-CM

## 2022-05-03 ENCOUNTER — Encounter (HOSPITAL_COMMUNITY)
Admission: RE | Admit: 2022-05-03 | Discharge: 2022-05-03 | Disposition: A | Discharge status: Home / Self Care | Payer: Managed Care, Other (non HMO) | Source: Ambulatory Visit | Attending: Endocrinology | Admitting: Endocrinology

## 2022-05-03 ENCOUNTER — Ambulatory Visit (HOSPITAL_COMMUNITY)
Admission: RE | Admit: 2022-05-03 | Discharge: 2022-05-03 | Disposition: A | Discharge status: Home / Self Care | Payer: Managed Care, Other (non HMO) | Source: Ambulatory Visit | Attending: Endocrinology | Admitting: Endocrinology

## 2022-05-03 DIAGNOSIS — E010 Iodine-deficiency related diffuse (endemic) goiter: Secondary | ICD-10-CM | POA: Diagnosis not present

## 2022-05-03 MED ORDER — SODIUM IODIDE I-123 7.4 MBQ CAPS
470.0000 | ORAL_CAPSULE | Freq: Once | ORAL | Status: AC
  Administered 2022-05-03: 470 via ORAL

## 2022-05-04 ENCOUNTER — Encounter (HOSPITAL_COMMUNITY)
Admission: RE | Admit: 2022-05-04 | Discharge: 2022-05-04 | Disposition: A | Discharge status: Home / Self Care | Payer: Managed Care, Other (non HMO) | Source: Ambulatory Visit | Attending: Endocrinology | Admitting: Endocrinology

## 2022-05-16 ENCOUNTER — Other Ambulatory Visit: Payer: Self-pay | Admitting: Endocrinology

## 2022-05-16 DIAGNOSIS — E0590 Thyrotoxicosis, unspecified without thyrotoxic crisis or storm: Secondary | ICD-10-CM

## 2022-05-31 ENCOUNTER — Ambulatory Visit
Admission: RE | Admit: 2022-05-31 | Discharge: 2022-05-31 | Disposition: A | Discharge status: Home / Self Care | Payer: Managed Care, Other (non HMO) | Source: Ambulatory Visit | Attending: Endocrinology | Admitting: Endocrinology

## 2022-05-31 DIAGNOSIS — E0590 Thyrotoxicosis, unspecified without thyrotoxic crisis or storm: Secondary | ICD-10-CM

## 2022-10-18 ENCOUNTER — Other Ambulatory Visit: Payer: Self-pay | Admitting: Endocrinology

## 2022-10-18 DIAGNOSIS — E012 Iodine-deficiency related (endemic) goiter, unspecified: Secondary | ICD-10-CM

## 2022-10-19 ENCOUNTER — Ambulatory Visit
Admission: RE | Admit: 2022-10-19 | Discharge: 2022-10-19 | Discharge status: Home / Self Care | Payer: Managed Care, Other (non HMO) | Source: Ambulatory Visit | Attending: Endocrinology | Admitting: Endocrinology

## 2022-10-19 DIAGNOSIS — E012 Iodine-deficiency related (endemic) goiter, unspecified: Secondary | ICD-10-CM

## 2022-10-24 ENCOUNTER — Other Ambulatory Visit: Payer: Self-pay | Admitting: Endocrinology

## 2022-10-24 ENCOUNTER — Other Ambulatory Visit: Payer: Self-pay | Admitting: Gastroenterology

## 2022-10-24 DIAGNOSIS — E010 Iodine-deficiency related diffuse (endemic) goiter: Secondary | ICD-10-CM

## 2022-10-25 ENCOUNTER — Other Ambulatory Visit (HOSPITAL_COMMUNITY)
Admission: RE | Admit: 2022-10-25 | Discharge: 2022-10-25 | Disposition: A | Discharge status: Home / Self Care | Payer: Managed Care, Other (non HMO) | Source: Ambulatory Visit | Attending: Endocrinology | Admitting: Endocrinology

## 2022-10-25 ENCOUNTER — Ambulatory Visit
Admission: RE | Admit: 2022-10-25 | Discharge: 2022-10-25 | Disposition: A | Discharge status: Home / Self Care | Payer: Managed Care, Other (non HMO) | Source: Ambulatory Visit | Attending: Endocrinology | Admitting: Endocrinology

## 2022-10-25 DIAGNOSIS — E041 Nontoxic single thyroid nodule: Secondary | ICD-10-CM

## 2022-10-25 DIAGNOSIS — E010 Iodine-deficiency related diffuse (endemic) goiter: Secondary | ICD-10-CM

## 2022-10-28 LAB — CYTOLOGY - NON PAP

## 2023-04-26 ENCOUNTER — Other Ambulatory Visit: Payer: Self-pay | Admitting: Internal Medicine

## 2023-04-26 ENCOUNTER — Telehealth: Payer: Self-pay | Admitting: Gastroenterology

## 2023-04-26 NOTE — Telephone Encounter (Signed)
This is a patient of Dr Christella Hartigan.  He is requesting refills on omeprazole.  He is scheduled to follow up in the clinic with you on 06-27-23.  Is it OK to refill this medication under your name?

## 2023-04-26 NOTE — Telephone Encounter (Signed)
Inbound call from patient requesting a refill for Omeprazole. Patient is schedule for 8/20.Please advise , Thank you

## 2023-04-27 MED ORDER — OMEPRAZOLE 40 MG PO CPDR: 40.0000 mg | DELAYED_RELEASE_CAPSULE | Freq: Every day | ORAL | 3 refills | Status: DC | PRN

## 2023-04-27 NOTE — Telephone Encounter (Signed)
Rx for omeprazole sent to the pharmacy as requested.  

## 2023-06-27 ENCOUNTER — Encounter: Payer: Self-pay | Admitting: Gastroenterology

## 2023-06-27 ENCOUNTER — Ambulatory Visit: Payer: Managed Care, Other (non HMO) | Admitting: Nurse Practitioner

## 2023-06-27 ENCOUNTER — Encounter: Payer: Self-pay | Admitting: Nurse Practitioner

## 2023-06-27 DIAGNOSIS — Z800 Family history of malignant neoplasm of digestive organs: Secondary | ICD-10-CM

## 2023-06-27 DIAGNOSIS — K200 Eosinophilic esophagitis: Secondary | ICD-10-CM | POA: Diagnosis not present

## 2023-06-27 MED ORDER — OMEPRAZOLE 40 MG PO CPDR: 40.0000 mg | DELAYED_RELEASE_CAPSULE | Freq: Every morning | ORAL | 4 refills | Status: DC

## 2023-06-27 MED ORDER — CLENPIQ 10-3.5-12 MG-GM -GM/160ML PO SOLN: 1.0000 | ORAL | 0 refills | Status: DC

## 2023-06-27 NOTE — Patient Instructions (Addendum)
_______________________________________________________  If your blood pressure at your visit was 140/90 or greater, please contact your primary care physician to follow up on this.  If you are age 43 or younger, your body mass index should be between 19-25. Your Body mass index is 37.36 kg/m. If this is out of the aformentioned range listed, please consider follow up with your Primary Care Provider.  ________________________________________________________  The [facility] providers would like to encourage you to use [facility] to communicate with providers for non-urgent requests or questions.  Due to long hold times on the telephone, sending your provider a message by [facility] may be a faster and more efficient way to get a response.  Please allow 48 business hours for a response.  Please remember that this is for non-urgent requests.  _______________________________________________________  We have sent the following medications to your pharmacy for you to pick up at your convenience: Pantoprazole 40mg  one tablet daily before breakfast meal.  You have been scheduled for a colonoscopy. Please follow written instructions given to you at your visit today.   Please pick up your prep supplies at the pharmacy within the next 1-3 days.  If you use inhalers (even only as needed), please bring them with you on the day of your procedure.  DO NOT TAKE 7 DAYS PRIOR TO TEST- Trulicity (dulaglutide) Ozempic, Wegovy (semaglutide) Mounjaro (tirzepatide) Bydureon Bcise (exanatide extended release)  DO NOT TAKE 1 DAY PRIOR TO YOUR TEST Rybelsus (semaglutide) Adlyxin (lixisenatide) Victoza (liraglutide) Byetta (exanatide) ___________________________________________________________________________  Due to recent changes in healthcare laws, you may see the results of your imaging and laboratory studies on MyChart before your provider has had a chance to review them.  We understand that in some cases there  may be results that are confusing or concerning to you. Not all laboratory results come back in the same time frame and the provider may be waiting for multiple results in order to interpret others.  Please give [place] 48 hours in order for your provider to thoroughly review all the results before contacting the office for clarification of your results.   Thank you for entrusting me with your care and choosing [facility].  Willette Cluster, NP

## 2023-06-27 NOTE — Progress Notes (Signed)
Primary GI: Wendall Papa, MD       ASSESSMENT & PLAN   42 year old male with a history of eosinophilic esophagitis, obesity  Eosinophilic esophagitis. Asymptomatic as long as he takes PPI. Ran out of Omeprazole for a few weeks and has started having reflux and recurrent dysphagia.  -- Resume omeprazole 40 mg every morning.  Rx for #90 with 4 refills  [facility] of colon cancer. Father just diagnosed with stage IV colon cancer (although he is 43 yo). Patient has no alarm symptoms.  --Schedule for a screening colonoscopy. The risks and benefits of colonoscopy with possible polypectomy / biopsies were discussed and the patient agrees to proceed.    HPI   Brief GI history Drew Cameron has previously been followed by Dr. Christella Hartigan for eosinophilic esophagitis He was last seen here in January 2022   INTERVAL HISTORY    Chief complaint : Need refill on omeprazole.  Also discussed colonoscopy  Patient 's father was diagnosed with stage IV colon cancer last week at age 86. Sounds like he had a ureteral obstruction, needed a urostomy. Also colostomy recommended but he refused.  Drew Cameron bowel habits have not changed. At baseline he frequently has to have a BM after meals. Stools formed. No blood in stool.   Patient says he is an "agent orange baby'   Previous GI Endoscopies / Labs / Imaging   **May not include all endoscopic evaluations   Most recent EGD was Oct 2021 - Esophageal mucosal changes that were consistent with EoE. Biopsies taken (on PPI BID) - The examination was otherwise normal Diagnosis 1. Surgical [P], distal esophagus - INCREASED INTRAEPITHELIAL EOSINOPHILS (FOCALLY >20 PER HIGH POWER FIELD), SEE COMMENT. - NO INTESTINAL METAPLASIA, DYSPLASIA, OR MALIGNANCY. 2. Surgical [P], proximal esophagus - INCREASED INTRAEPITHELIAL EOSINOPHILS (>25 PER HPF), SEE COMMENT. - NO INTESTINAL METAPLASIA, DYSPLASIA, OR MALIGNANCY. Microscopic Comment 1. and 2. The findings are consistent with  eosinophilic esophagitis.    Past Medical History:  Diagnosis Date   Eosinophilic esophagitis    Esophageal stricture     Past Surgical History:  Procedure Laterality Date   ANKLE ARTHROPLASTY     ESOPHAGOGASTRODUODENOSCOPY N/A 06/13/2015   Procedure: ESOPHAGOGASTRODUODENOSCOPY (EGD);  Surgeon: Rachael Fee, MD;  Location: [facility];  Service: Endoscopy;  Laterality: N/A;   UPPER GASTROINTESTINAL ENDOSCOPY      Family History  Problem Relation Age of Onset   Hiatal hernia Father    Heart attack Father    Heart disease Father    Asthma Father    Prostate cancer Maternal Grandfather    Colon cancer Maternal Grandfather    Lung cancer Maternal Grandfather    Pancreatic cancer Maternal Grandfather    Diabetes Mother    Colon polyps Neg Hx    Stomach cancer Neg Hx    Esophageal cancer Neg Hx    Liver cancer Neg Hx    Rectal cancer Neg Hx    Urticaria Neg Hx    Eczema Neg Hx    Allergic rhinitis Neg Hx     Current Medications, Allergies, Family History and Social History were reviewed in [employer] record.     Current Outpatient Medications  Medication Sig Dispense Refill   Multiple Vitamin (MULTIVITAMIN) tablet Take 1 tablet by mouth daily. Men's     Omega-3 Fatty Acids (FISH OIL) 1000 MG CAPS Take 1 capsule by mouth daily.      omeprazole (PRILOSEC) 40 MG capsule Take 1 capsule (40 mg total) by mouth  daily as needed. PT NEEDS APPT FOR FURTHER REFILLS 30 capsule 3   No current facility-administered medications for this visit.    Review of Systems: No chest pain. No shortness of breath. No urinary complaints.    Physical Exam  Wt Readings from Last 3 Encounters:  11/24/20 230 lb 9.6 oz (104.6 kg)  09/28/20 240 lb 6 oz (109 kg)  09/24/20 233 lb (105.7 kg)    BP (!) 150/88   Pulse 71   Ht 5\' 9"  (1.753 m)   Wt 253 lb (114.8 kg)   BMI 37.36 kg/m  Constitutional:  Pleasant, generally well appearing male in no acute  distress. Psychiatric: Normal mood and affect. Behavior is normal. EENT: Pupils normal.  Conjunctivae are normal. No scleral icterus. Neck supple.  Cardiovascular: Normal rate, regular rhythm.  Pulmonary/chest: Effort normal and breath sounds normal. No wheezing, rales or rhonchi. Abdominal: Soft, nondistended, nontender. Bowel sounds active throughout. There are no masses palpable. No hepatomegaly. Neurological: Alert and oriented to person place and time.    Willette Cluster, NP  06/27/2023, 8:19 AM

## 2023-06-28 NOTE — Progress Notes (Signed)
Agree with assessment/plan.  Raj Gupta, MD [facility] [phone]  

## 2023-07-04 ENCOUNTER — Ambulatory Visit (AMBULATORY_SURGERY_CENTER): Payer: Managed Care, Other (non HMO) | Admitting: Gastroenterology

## 2023-07-04 ENCOUNTER — Encounter: Payer: Self-pay | Admitting: Gastroenterology

## 2023-07-04 VITALS — BP 152/77 | HR 65 | Temp 98.3°F | Resp 14 | Ht 69.0 in | Wt 253.0 lb

## 2023-07-04 DIAGNOSIS — Z1211 Encounter for screening for malignant neoplasm of colon: Secondary | ICD-10-CM

## 2023-07-04 DIAGNOSIS — Z800 Family history of malignant neoplasm of digestive organs: Secondary | ICD-10-CM | POA: Diagnosis not present

## 2023-07-04 MED ORDER — SODIUM CHLORIDE 0.9 % IV SOLN: 500.0000 mL | Freq: Once | INTRAVENOUS | Status: DC

## 2023-07-04 NOTE — Progress Notes (Signed)
Vss nad trans to pacu 

## 2023-07-04 NOTE — Patient Instructions (Signed)
    Handouts on diverticulosis & hemorrhoids given to you today  Repeat Colonoscopy in 5 yrs   Resume usual diet & medications    YOU HAD AN ENDOSCOPIC PROCEDURE TODAY AT THE [facility]:   Refer to the procedure report that was given to you for any specific questions about what was found during the examination.  If the procedure report does not answer your questions, please call your gastroenterologist to clarify.  If you requested that your care partner not be given the details of your procedure findings, then the procedure report has been included in a sealed envelope for you to review at your convenience later.  YOU SHOULD EXPECT: Some feelings of bloating in the abdomen. Passage of more gas than usual.  Walking can help get rid of the air that was put into your GI tract during the procedure and reduce the bloating. If you had a lower endoscopy (such as a colonoscopy or flexible sigmoidoscopy) you may notice spotting of blood in your stool or on the toilet paper. If you underwent a bowel prep for your procedure, you may not have a normal bowel movement for a few days.  Please Note:  You might notice some irritation and congestion in your nose or some drainage.  This is from the oxygen used during your procedure.  There is no need for concern and it should clear up in a day or so.  SYMPTOMS TO REPORT IMMEDIATELY:  Following lower endoscopy (colonoscopy or flexible sigmoidoscopy):  Excessive amounts of blood in the stool  Significant tenderness or worsening of abdominal pains  Swelling of the abdomen that is new, acute  Fever of 100F or higher   For urgent or emergent issues, a gastroenterologist can be reached at any hour by calling [phone]. Do not use MyChart messaging for urgent concerns.    DIET:  We do recommend a small meal at first, but then you may proceed to your regular diet.  Drink plenty of fluids but you should avoid alcoholic beverages for 24  hours.  ACTIVITY:  You should plan to take it easy for the rest of today and you should NOT DRIVE or use heavy machinery until tomorrow (because of the sedation medicines used during the test).    FOLLOW UP: Our staff will call the number listed on your records the next business day following your procedure.  We will call around 7:15- 8:00 am to check on you and address any questions or concerns that you may have regarding the information given to you following your procedure. If we do not reach you, we will leave a message.     If any biopsies were taken you will be contacted by phone or by letter within the next 1-3 weeks.  Please call us at [phone] if you have not heard about the biopsies in 3 weeks.    SIGNATURES/CONFIDENTIALITY: You and/or your care partner have signed paperwork which will be entered into your electronic medical record.  These signatures attest to the fact that that the information above on your After Visit Summary has been reviewed and is understood.  Full responsibility of the confidentiality of this discharge information lies with you and/or your care-partner.

## 2023-07-04 NOTE — Op Note (Signed)
[name]: Drew Cameron [facility] Procedure Date: 07/04/2023 1:20 PM MRN: [id number] Endoscopist: Lynann Bologna , MD, [id number] Age: 42 Referring MD:  Date of Birth: October 20, 1981 Gender: Male Account #: [id number] Procedure:                Colonoscopy Indications:              Screening in patient at increased risk: Colorectal                            cancer in father 64 or older Medicines:                Monitored Anesthesia Care Procedure:                Pre-Anesthesia Assessment:                           - Prior to the procedure, a History and Physical                            was performed, and patient medications and                            allergies were reviewed. The patient's tolerance of                            previous anesthesia was also reviewed. The risks                            and benefits of the procedure and the sedation                            options and risks were discussed with the patient.                            All questions were answered, and informed consent                            was obtained. Prior Anticoagulants: The patient has                            taken no anticoagulant or antiplatelet agents. ASA                            Grade Assessment: II - A patient with mild systemic                            disease. After reviewing the risks and benefits,                            the patient was deemed in satisfactory condition to                            undergo the procedure.  After obtaining informed consent, the colonoscope                            was passed under direct vision. Throughout the                            procedure, the patient's blood pressure, pulse, and                            oxygen saturations were monitored continuously. The                            Olympus CF-HQ190L [id number]) Colonoscope was                            introduced through the anus and  advanced to the the                            cecum, identified by appendiceal orifice and                            ileocecal valve. The colonoscopy was performed                            without difficulty. The patient tolerated the                            procedure well. The quality of the bowel                            preparation was good. The terminal ileum, ileocecal                            valve, appendiceal orifice, and rectum were                            photographed. Scope In: 1:32:23 PM Scope Out: 1:43:00 PM Scope Withdrawal Time: 0 hours 6 minutes 11 seconds  Total Procedure Duration: 0 hours 10 minutes 37 seconds  Findings:                 A few small-mouthed diverticula were found in the                            sigmoid colon.                           Non-bleeding internal hemorrhoids were found during                            retroflexion. The hemorrhoids were small and Grade                            I (internal hemorrhoids that do not prolapse).  The terminal ileum appeared normal.                           The exam was otherwise without abnormality on                            direct and retroflexion views. Complications:            No immediate complications. Estimated Blood Loss:     Estimated blood loss: none. Impression:               - Mild sigmoid diverticulosis.                           - Non-bleeding internal hemorrhoids.                           - The examined portion of the ileum was normal.                           - The examination was otherwise normal on direct                            and retroflexion views.                           - No specimens collected. Recommendation:           - Patient has a contact number available for                            emergencies. The signs and symptoms of potential                            delayed complications were discussed with the                             patient. Return to normal activities tomorrow.                            Written discharge instructions were provided to the                            patient.                           - Resume previous diet.                           - Continue present medications.                           - Repeat colonoscopy in 5 years for screening                            purposes. Earlier, if with any new problems or  change in family history.                           - The findings and recommendations were discussed                            with the patient's family. Lynann Bologna, MD 07/04/2023 1:47:57 PM This report has been signed electronically.

## 2023-07-04 NOTE — Progress Notes (Signed)
[facility] History and Physical   Primary Care Physician:  [facility]   Reason for Procedure:   FH CRC (dad at age 43)  Plan:    colon     HPI: Drew Cameron is a 42 y.o. male    Past Medical History:  Diagnosis Date   Eosinophilic esophagitis    Esophageal stricture     Past Surgical History:  Procedure Laterality Date   ANKLE ARTHROPLASTY     ESOPHAGOGASTRODUODENOSCOPY N/A 06/13/2015   Procedure: ESOPHAGOGASTRODUODENOSCOPY (EGD);  Surgeon: Rachael Fee, MD;  Location: [facility];  Service: Endoscopy;  Laterality: N/A;   UPPER GASTROINTESTINAL ENDOSCOPY      Prior to Admission medications   Medication Sig Start Date End Date Taking? Authorizing Provider  omeprazole (PRILOSEC) 40 MG capsule Take 1 capsule (40 mg total) by mouth in the morning. 06/27/23 07/27/23 Yes Meredith Pel, NP  Multiple Vitamin (MULTIVITAMIN) tablet Take 1 tablet by mouth daily. Men's    [provider]  Omega-3 Fatty Acids (FISH OIL) 1000 MG CAPS Take 1 capsule by mouth daily.     [provider]    Current Outpatient Medications  Medication Sig Dispense Refill   omeprazole (PRILOSEC) 40 MG capsule Take 1 capsule (40 mg total) by mouth in the morning. 90 capsule 4   Multiple Vitamin (MULTIVITAMIN) tablet Take 1 tablet by mouth daily. Men's     Omega-3 Fatty Acids (FISH OIL) 1000 MG CAPS Take 1 capsule by mouth daily.      Current Facility-Administered Medications  Medication Dose Route Frequency Provider Last Rate Last Admin   0.9 %  sodium chloride infusion  500 mL Intravenous Once Lynann Bologna, MD        Allergies as of 07/04/2023   (No Known Allergies)    Family History  Problem Relation Age of Onset   Hiatal hernia Father    Heart attack Father    Heart disease Father    Asthma Father    Prostate cancer Maternal Grandfather    Colon cancer Maternal Grandfather    Lung cancer Maternal Grandfather    Pancreatic  cancer Maternal Grandfather    Diabetes Mother    Colon polyps Neg Hx    Stomach cancer Neg Hx    Esophageal cancer Neg Hx    Liver cancer Neg Hx    Rectal cancer Neg Hx    Urticaria Neg Hx    Eczema Neg Hx    Allergic rhinitis Neg Hx     Social History   Socioeconomic History   Marital status: Married    Spouse name: Not on file   Number of children: Not on file   Years of education: Not on file   Highest education level: Not on file  Occupational History   Not on file  Tobacco Use   Smoking status: Former    Types: Cigarettes   Smokeless tobacco: Current    Types: Chew  Vaping Use   Vaping status: Never Used  Substance and Sexual Activity   Alcohol use: Yes    Alcohol/week: 0.0 standard drinks of alcohol    Comment: a few times yearly   Drug use: No   Sexual activity: Not on file  Other Topics Concern   Not on file  Social History Narrative   Not on file   Social Determinants of Health   Financial Resource Strain: Not on file  Food Insecurity: No Food Insecurity (01/12/2022)  Received from [facility], [facility]   Hunger Vital Sign    Worried About Running Out of Food in the Last Year: Never true    Ran Out of Food in the Last Year: Never true  Transportation Needs: Not on file  Physical Activity: Not on file  Stress: Not on file  Social Connections: Unknown (03/16/2022)   Received from [facility], [facility]   Social Network    Social Network: Not on file  Intimate Partner Violence: Unknown (02/09/2022)   Received from [facility], [facility]   HITS    Physically Hurt: Not on file    Insult or Talk Down To: Not on file    Threaten Physical Harm: Not on file    Scream or Curse: Not on file    Review of Systems: Positive for none All other review of systems negative except as mentioned in the HPI.  Physical Exam: Vital signs in last 24 hours: @VSRANGES @   General:   Alert,  Well-developed, well-nourished, pleasant and cooperative  in NAD Lungs:  Clear throughout to auscultation.   Heart:  Regular rate and rhythm; no murmurs, clicks, rubs,  or gallops. Abdomen:  Soft, nontender and nondistended. Normal bowel sounds.   Neuro/Psych:  Alert and cooperative. Normal mood and affect. A and O x 3    No significant changes were identified.  The patient continues to be an appropriate candidate for the planned procedure and anesthesia.   [name], MD. [facility] 07/04/2023 1:14 PM@

## 2023-07-05 ENCOUNTER — Telehealth: Payer: Self-pay | Admitting: *Deleted

## 2023-07-05 NOTE — Telephone Encounter (Signed)
  Follow up Call-     07/04/2023   12:51 PM  Call back number  Post procedure Call Back phone  # [phone]  Permission to leave phone message Yes     Patient questions:  Do you have a fever, pain , or abdominal swelling? No. Pain Score  0 *  Have you tolerated food without any problems? Yes.    Have you been able to return to your normal activities? Yes.    Do you have any questions about your discharge instructions: Diet   No. Medications  No. Follow up visit  No.  Do you have questions or concerns about your Care? No.  Actions: * If pain score is 4 or above: No action needed, pain <4.

## 2023-08-30 NOTE — ED Provider Notes (Signed)
 [facility]  ED Provider Note  Drew Cameron 42 y.o. male DOB: 07-12-1981 MRN: [id number] History   Chief Complaint  Patient presents with  . Cough    Pt endorses cough x 6 days, diagnosed with strep and sinus infection at UC at Saturday, on antibiotics and inhaler without relief.    Patient is a 42 year old male presents emergency department today with cough x 7 days.  He states that he has had progressively worsening cough, body aches and flulike symptoms overall.  He was evaluated 2 days ago at urgent care as he was concerned he may have the flu and was instead diagnosed with strep and a sinus infection.  Patient states he was having some sore throat but mostly thought this was from how much she was coughing.  He states that the cough is nonproductive.  He has not had fevers at home.  He states he is feeling increasingly short of breath secondary to inability to stop the coughing.  He was started on Augmentin, Tessalon Perles, prednisone, albuterol and hydrocodone cough medication without significant improvement in the symptoms.  He states that he is mostly fatigued because he cannot sleep due to how much he is coughing.   Cough Associated symptoms: chills, myalgias, shortness of breath and sore throat   Associated symptoms: no chest pain, no ear pain, no eye discharge, no fever, no headaches, no rash and no wheezing       No past medical history on file.  No past surgical history on file.  Social History   Substance and Sexual Activity  Alcohol Use Not on file   Social History   Tobacco Use  Smoking Status Never  . Passive exposure: Never  Smokeless Tobacco Current  . Types: Snuff  Tobacco Comments   Every now and then patient uses grizzly dip    E-Cigarettes  . Vaping Use    . Start Date    . Cartridges/Day    . Quit Date     Social History   Substance and Sexual Activity  Drug Use Not on file         No Known  Allergies  Discharge Medication List as of 08/30/2023 12:43 PM     CONTINUE these medications which have NOT CHANGED   Details  fish oil (SEA-OMEGA) 1000 mg CAPS Take one capsule (1,000 mg dose) by mouth daily., Historical Med    ibuprofen  (ADVIL ,MOTRIN ) 800 mg tablet SMARTSIG:1 Tablet(s) By Mouth Every 8 Hours PRN, Historical Med    Multiple Vitamin (MULTIVITAMIN) tablet Take one tablet by mouth daily., Historical Med    omeprazole  (PRILOSEC ) 40 mg capsule Take one capsule (40 mg dose) by mouth daily as needed., Starting Fri 09/17/2021, Until Sun 10/17/2021 at 2359, Historical Med        Primary Survey  Primary Survey  Review of Systems   Review of Systems  Constitutional:  Positive for chills and fatigue. Negative for activity change, appetite change, fever and unexpected weight change.  HENT:  Positive for sore throat. Negative for ear pain.   Eyes:  Negative for pain, discharge and redness.  Respiratory:  Positive for cough and shortness of breath. Negative for wheezing.   Cardiovascular:  Negative for chest pain, palpitations and leg swelling.  Gastrointestinal:  Negative for abdominal pain, diarrhea, nausea and vomiting.  Genitourinary:  Negative for decreased urine volume, difficulty urinating, dysuria, hematuria and urgency.  Musculoskeletal:  Positive for arthralgias and myalgias. Negative for back pain  and neck pain.  Skin:  Negative for rash and wound.  Allergic/Immunologic: Negative for immunocompromised state.  Neurological:  Negative for syncope, light-headedness and headaches.  Hematological:  Negative for adenopathy. Does not bruise/bleed easily.  Psychiatric/Behavioral:  Negative for confusion. The patient is not nervous/anxious.   All other systems reviewed and are negative.   Physical Exam   ED Triage Vitals  BP 08/30/23 1058 (!) 158/103  Heart Rate 08/30/23 1058 94  Resp 08/30/23 1112 20  SpO2 08/30/23 1058 96 %  Temp 08/30/23 1058 98.3 F (36.8 C)     Physical Exam  Constitutional: He appears well-developed and well-nourished. He is in good hygiene. He has good hygiene. He does not appear distressed, does not appear ill and no respiratory distress. Not diaphoretic. HENT:  Head: Normocephalic and atraumatic.  Right Ear: Normal external ear.  Left Ear: Normal external ear.  Nose: Nose normal.  Hoarse voice  Eyes: EOM are intact. Pupils are equal, round, and reactive to light. Right eye: no drainage. no conjunctival injection. Left eye: no drainage. no conjunctival injection. No scleral icterus.  Neck: Normal range of motion. Normal range of motion.  Cardiovascular: Normal rate and regular rhythm.  No audible murmur. No friction rub and gallop.  Pulmonary/Chest: No respiratory distress. Not tachypneic. Respiratory effort normal. No wheezing. Rhonchi. No rales. Persistent coughing  Musculoskeletal: Normal range of motion.     Cervical back: Normal range of motion. Normal range of motion.   Neurological: He is alert and oriented to person, place, and time. Gait normal. He has normal speech. No facial weakness.Gait normal. GCS Total Score = 15.  , eye opening = 4 , verbal response = 5 , best motor response = 6 , pupil unreactive to light = 0  no facial droop.  Skin: Skin is not clammy. Skin is warm. Not diaphoretic. Skin is dry. No rash noted. No pallor.  No cyanosis.  No obvious rash, abrasions/lacerations or lesions to exposed areas of skin.   Psychiatric: He has a normal mood and affect. His speech is normal. His behavior is normal.    ED Course   Lab results:   CBC AND DIFFERENTIAL - Abnormal      Result Value   WBC 11.8 (*)    RBC 4.95     HGB 15.0     HCT 45.3     MCV 91.5     MCH 30.3     MCHC 33.1     Plt Ct 371     RDW SD 42.4     MPV 9.3 (*)    NRBC% 0.0     Absolute NRBC Count 0.00     NEUTROPHIL % 84.3     LYMPHOCYTE % 9.7     MONOCYTE % 5.4     Eosinophil % 0.1     BASOPHIL % 0.2     IG% 0.3     ABSOLUTE  NEUTROPHIL COUNT 9.97 (*)    ABSOLUTE LYMPHOCYTE COUNT 1.15     Absolute Monocyte Count 0.64     Absolute Eosinophil Count 0.01     Absolute Basophil Count 0.02     Absolute Immature Granulocyte Count 0.04 (*)   COMPREHENSIVE METABOLIC PANEL - Abnormal   Na 140     Potassium 3.9     Cl 104     CO2 23     AGAP 13     Glucose 129 (*)    BUN 13     Creatinine  0.91     Ca 9.4     ALK PHOS 85     T Bili 0.34     Total Protein 7.8     Alb 4.2     GLOBULIN 3.6     ALBUMIN/GLOBULIN RATIO 1.2     BUN/CREAT RATIO 14.3     ALT 52     AST 37     eGFR 107     Comment: Normal GFR (glomerular filtration rate) > 60 mL/min/1.73 meters squared, < 60 may include impaired kidney function. Calculation based on the Chronic Kidney Disease Epidemiology Collaboration (CK-EPI)equation refit without adjustment for race.    Imaging:   XR CHEST AP PORTABLE   Narrative:    TECHNIQUE: 1 views of the Chest  COMPARISON: None  INDICATION: Cough  FINDINGS:  Ill defined right upper lung opacity. No pleural effusion or pneumothorax. The cardiomediastinal silhouette is unremarkable.     Impression:    IMPRESSION:  1. RIght upper lung opacity may represent infiltrate.    Electronically Signed by: Trenda Blanch, MD on 08/30/2023 11:50 AM    ECG: ECG Results          ECG 12 lead (Final result)  Result time 08/30/23 13:36:10    Final result             Narrative:   Diagnosis Class Abnormal Acquisition Device D3K Ventricular Rate 100 Atrial Rate 100 P-R Interval 142 QRS Duration 86 Q-T Interval 338 QTC Calculation(Bazett) 436 Calculated P Axis 57 Calculated R Axis 84 Calculated T Axis -14  Diagnosis Normal sinus rhythm T wave abnormality, consider inferior ischemia Normal axis normal qt Abnormal ECG No previous ECGs available Pylant, Prentice ([id number]) on 08/30/2023 1:35:59 PM certifies that he/she has reviewed the ECG tracing and confirms the  independent interpretation  is correct.                                                 Pre-Sedation Procedures    Medical Decision Making Patient is a 42 year old male presents emergency department today with cough x 7 days.  Considered in my differential is viral URI, bronchitis, pneumonia, influenza or COVID, allergies, COPD exacerbation, CHF exacerbation and asthma exacerbation. Patient's O2 saturation is above 95% on room air.  Not tachycardic and afebrile.  Rhonchi throughout all lung fields without wheezing.  No chest wall tenderness.  No tracheal deviation.  No retractions or accessory muscle usage.  No peripheral edema to suggest CHF exacerbation.  No signs of DVT.  No reported hemoptysis.  CBC was significant for mild leukocytosis of 11.8 (patient is currently on prednisone) but was otherwise negative for anemia and platelet abnormalities.  CMP was negative for acute electrolyte abnormalities, liver enzyme abnormalities, elevated bilirubin or AKI.  CXR was significant for right sided pneumonia. Patient is already on augmentin but has only had about 2.5 days worth of medication. Will start him on doxycycline as well for dual antibiotic coverage. Will send home with Bromfed and encouraged Sudafed as well (patient has been taking Tessalon and albuterol without improvement. Also has been taking zyrtec but no other cough medications). He was given strict return precautions regarding new and worsening symptom that would warrant admission for IV antibiotics. Patient voices understanding and is safe for discharge at this time.  Patient has been reexamined and is ready to be discharged.  All diagnostic results have been reviewed and discussed with the patient.  After careful consideration review of the work-up completed today in the setting of patient's known medical problems, presenting problems, etc., I do feel that patient is safe for discharge at this time and is appropriate for outpatient management  as opposed to hospitalization.  Care plan has been outlined with patient understands all current diagnoses, results and treatment plans.  There are no new complaints, changes, or physical findings at this time.  All questions have been addressed.  All medications were reviewed with the patient.  The patient has been instructed to and agrees to follow-up with PCP, as well as return to the ED upon further deterioration.   Problems Addressed: Community acquired pneumonia, unspecified laterality: complicated acute illness or injury  Amount and/or Complexity of Data Reviewed Labs: ordered. Decision-making details documented in ED Course. Radiology: ordered. Decision-making details documented in ED Course.  Risk Prescription drug management.         Provider Communication  Discharge Medication List as of 08/30/2023 12:43 PM     START taking these medications   Details  doxycycline hyclate (VIBRAMYCIN) 100 mg capsule Take one capsule (100 mg dose) by mouth 2 (two) times daily for 7 days., Starting Wed 08/30/2023, Until Wed 09/06/2023, Normal    pseudoephedrine-brompheniramine-DM (BROMFED-DM) 30-2-10 mg/86mL syrup Take 10 mLs by mouth 4 (four) times a day as needed for up to 10 days., Starting Wed 08/30/2023, Until Sat 09/09/2023 at 2359, Normal        Discharge Medication List as of 08/30/2023 12:43 PM      Discharge Medication List as of 08/30/2023 12:43 PM      Clinical Impression Final diagnoses:  Community acquired pneumonia, unspecified laterality    ED Disposition     ED Disposition  Discharge   Condition  Stable   Comment  --                 Follow-up Information     [facility] Emergency Department.   Specialty: Emergency Medicine Comments: If symptoms worsen Contact information: [address] [facility] [place]  [phone]        [facility] Emergency Department.   Specialty: Emergency Medicine Comments: If  symptoms worsen Contact information: [address] [facility] [place]  [address] [phone]                 Electronically signed by:    Charmaine FORBES Blush, PA-C 08/30/23 1527

## 2024-03-15 ENCOUNTER — Other Ambulatory Visit: Payer: Self-pay | Admitting: Endocrinology

## 2024-03-15 DIAGNOSIS — E012 Iodine-deficiency related (endemic) goiter, unspecified: Secondary | ICD-10-CM

## 2024-03-20 ENCOUNTER — Ambulatory Visit
Admission: RE | Admit: 2024-03-20 | Discharge: 2024-03-20 | Disposition: A | Discharge status: Home / Self Care | Source: Ambulatory Visit | Attending: Endocrinology | Admitting: Endocrinology

## 2024-03-20 DIAGNOSIS — E012 Iodine-deficiency related (endemic) goiter, unspecified: Secondary | ICD-10-CM

## 2024-03-25 ENCOUNTER — Other Ambulatory Visit: Payer: Self-pay | Admitting: Endocrinology

## 2024-03-25 DIAGNOSIS — E041 Nontoxic single thyroid nodule: Secondary | ICD-10-CM

## 2024-03-26 ENCOUNTER — Other Ambulatory Visit (HOSPITAL_COMMUNITY)
Admission: RE | Admit: 2024-03-26 | Discharge: 2024-03-26 | Disposition: A | Discharge status: Home / Self Care | Source: Ambulatory Visit | Attending: Student | Admitting: Student

## 2024-03-26 ENCOUNTER — Ambulatory Visit
Admission: RE | Admit: 2024-03-26 | Discharge: 2024-03-26 | Disposition: A | Discharge status: Home / Self Care | Source: Ambulatory Visit | Attending: Endocrinology | Admitting: Endocrinology

## 2024-03-26 DIAGNOSIS — E041 Nontoxic single thyroid nodule: Secondary | ICD-10-CM

## 2024-04-05 LAB — CYTOLOGY - NON PAP

## 2024-04-09 ENCOUNTER — Encounter (HOSPITAL_COMMUNITY): Payer: Self-pay

## 2024-05-16 ENCOUNTER — Ambulatory Visit: Attending: Cardiology | Admitting: Cardiology

## 2024-05-16 ENCOUNTER — Ambulatory Visit

## 2024-05-16 ENCOUNTER — Encounter: Payer: Self-pay | Admitting: Cardiology

## 2024-05-16 ENCOUNTER — Telehealth: Payer: Self-pay | Admitting: Radiology

## 2024-05-16 VITALS — BP 136/78 | HR 74 | Ht 69.0 in | Wt 252.8 lb

## 2024-05-16 DIAGNOSIS — R002 Palpitations: Secondary | ICD-10-CM

## 2024-05-16 DIAGNOSIS — R0683 Snoring: Secondary | ICD-10-CM

## 2024-05-16 NOTE — Telephone Encounter (Signed)
 Patient agreement reviewed and signed on 05/16/2024.  WatchPAT issued to patient on 05/16/2024 by Woodie LOISE Prudent. Patient aware to not open the WatchPAT box until contacted with the activation PIN. Patient profile initialized in CloudPAT on 05/16/2024 by Rockie RAMAN. Device serial number: [id number]  Please list Reason for Call as Advice Only and type WatchPAT issued to patient in the comment box.

## 2024-05-16 NOTE — Patient Instructions (Signed)
 Medication Instructions:   Your physician recommends that you continue on your current medications as directed. Please refer to the Current Medication list given to you today.  *If you need a refill on your cardiac medications before your next appointment, please call your pharmacy*   Testing/Procedures:  Your physician has requested that you have an echocardiogram. Echocardiography is a painless test that uses sound waves to create images of your heart. It provides your doctor with information about the size and shape of your heart and how well your heart's chambers and valves are working. This procedure takes approximately one hour. There are no restrictions for this procedure. Please do NOT wear cologne, perfume, aftershave, or lotions (deodorant is allowed). Please arrive 15 minutes prior to your appointment time.  Please note: We ask at that you not bring children with you during ultrasound (echo/ vascular) testing. Due to room size and safety concerns, children are not allowed in the ultrasound rooms during exams. Our front office staff cannot provide observation of children in our lobby area while testing is being conducted. An adult accompanying a patient to their appointment will only be allowed in the ultrasound room at the discretion of the ultrasound technician under special circumstances. We apologize for any inconvenience.   Your physician has recommended that you have a ITAMAR sleep study. This test records several body functions during sleep, including: brain activity, eye movement, oxygen and carbon dioxide blood levels, heart rate and rhythm, breathing rate and rhythm, the flow of air through your mouth and nose, snoring, body muscle movements, and chest and belly movement.  YOU WILL BE PROVIDED THIS DEVICE TODAY IN CLINIC   ZIO XT- Long Term Monitor Instructions  Your physician has requested you wear a ZIO patch monitor for 14 days.   THIS WILL BE APPLIED IN THE CLINIC  TODAY This is a single patch monitor. [employer] supplies one patch monitor per enrollment. Additional stickers are not available. Please do not apply patch if you will be having a Nuclear Stress Test,  Echocardiogram, Cardiac CT, MRI, or Chest Xray during the period you would be wearing the  monitor. The patch cannot be worn during these tests. You cannot remove and re-apply the  ZIO XT patch monitor.  Your ZIO patch monitor will be mailed 3 day USPS to your address on file. It may take 3-5 days  to receive your monitor after you have been enrolled.  Once you have received your monitor, please review the enclosed instructions. Your monitor  has already been registered assigning a specific monitor serial # to you.  Billing and Patient Assistance Program Information  We have supplied [employer] with any of your insurance information on file for billing purposes. [employer] offers a sliding scale Patient Assistance Program for patients that do not have  insurance, or whose insurance does not completely cover the cost of the ZIO monitor.  You must apply for the Patient Assistance Program to qualify for this discounted rate.  To apply, please call [employer] at [phone], select option 4, select option 2, ask to apply for  Patient Assistance Program. Meredeth will ask your household income, and how many people  are in your household. They will quote your out-of-pocket cost based on that information.  [employer] will also be able to set up a 68-month, interest-free payment plan if needed.  Applying the monitor   Shave hair from upper left chest.  Hold abrader disc by orange tab. Rub abrader in 40 strokes over the upper  left chest as  indicated in your monitor instructions.  Clean area with 4 enclosed alcohol pads. Let dry.  Apply patch as indicated in monitor instructions. Patch will be placed under collarbone on left  side of chest with arrow pointing upward.  Rub patch adhesive wings for 2 minutes.  Remove white label marked 1. Remove the white  label marked 2. Rub patch adhesive wings for 2 additional minutes.  While looking in a mirror, press and release button in center of patch. A small green light will  flash 3-4 times. This will be your only indicator that the monitor has been turned on.  Do not shower for the first 24 hours. You may shower after the first 24 hours.  Press the button if you feel a symptom. You will hear a small click. Record Date, Time and  Symptom in the Patient Logbook.  When you are ready to remove the patch, follow instructions on the last 2 pages of Patient  Logbook. Stick patch monitor onto the last page of Patient Logbook.  Place Patient Logbook in the blue and white box. Use locking tab on box and tape box closed  securely. The blue and white box has prepaid postage on it. Please place it in the mailbox as  soon as possible. Your physician should have your test results approximately 7 days after the  monitor has been mailed back to [facility].  Call [facility] Customer Care at [phone] if you have questions regarding  your ZIO XT patch monitor. Call them immediately if you see an orange light blinking on your  monitor.  If your monitor falls off in less than 4 days, contact our Monitor department at [phone].  If your monitor becomes loose or falls off after 4 days call [employer] at [phone] for  suggestions on securing your monitor    Follow-Up:  3-4 MONTHS WITH AN EXTENDER

## 2024-05-16 NOTE — Progress Notes (Unsigned)
 Applied a 14 day Zio XT monitor to be mailed to patients home   05/20/24 [id number] fell off prematurely.  [id number] from office inventory applied to patient using tincture of benzoin.

## 2024-05-16 NOTE — Progress Notes (Signed)
 Cardiology Office Note:   Date:  05/16/2024  ID:  Drew Cameron, DOB [birth year], MRN [id number] PCP: [facility] Providers Cardiologist:  None    History of Present Illness:   Discussed the use of AI scribe software for clinical note transcription with the patient, who gave verbal consent to proceed.  History of Present Illness Drew Cameron is a 43 year old male with hyperthyroidism who presents with palpitations. He works for the [employer].  He experiences palpitations that began after developing thyroid  issues a couple of years ago. The sensation is described as his heart 'running away', primarily occurring at night when lying down, lasting 15 to 30 seconds. Episodes happen once or twice a night but not consecutively. No specific triggers have been identified, although he has significantly reduced caffeine intake, eliminating energy drinks and limiting himself to a cup of coffee or sweet tea occasionally. Since making these dietary changes, notices palpitations have improved.   He has a history of hyperthyroidism and is currently on methimazole, adjusted to every other day. He also takes atenolol 50mg  (prescribed by endocrinology), for blood pressure and palpitations, but has not noticed a significant change in his symptoms since starting it. His blood pressure was recently 136/78.  No significant changes in fatigue levels have been experienced since starting atenolol, although he reports being 'always tired'.  He has a history of testosterone replacement therapy due to low testosterone levels, initially in the 300s and now in the 400s. He wonders if this therapy might be contributing to his symptoms.  He reports loud snoring at night, noted by his wife, although no apneic episodes have been observed. He attributes recent weight gain to a COVID-19 vaccine he received and subsequent thyroid  issues. He has  attempted weight loss through programs like Weight Watchers in the past with success but has struggled recently.  Studies Reviewed:    EKG:   EKG Interpretation Date/Time:  Thursday May 16 2024 14:16:00 EDT Ventricular Rate:  78 PR Interval:  168 QRS Duration:  96 QT Interval:  370 QTC Calculation: 421 R Axis:   32  Text Interpretation: Sinus rhythm with sinus arrhythmia No previous ECGs available Confirmed by Trudy Birmingham [phone]) on 05/16/2024 2:21:57 PM      Risk Assessment/Calculations:              Physical Exam:   VS:  BP 136/78 (BP Location: Left Arm, Patient Position: Sitting, Cuff Size: Normal)   Pulse 74   Ht 5' 9 (1.753 m)   Wt 252 lb 12.8 oz (114.7 kg)   SpO2 96%   BMI 37.33 kg/m    Wt Readings from Last 3 Encounters:  05/16/24 252 lb 12.8 oz (114.7 kg)  07/04/23 253 lb (114.8 kg)  06/27/23 253 lb (114.8 kg)     Physical Exam Vitals reviewed.  Constitutional:      Appearance: Normal appearance. He is obese.  HENT:     Head: Normocephalic.  Eyes:     Pupils: Pupils are equal, round, and reactive to light.  Cardiovascular:     Rate and Rhythm: Normal rate and regular rhythm.     Pulses: Normal pulses.     Heart sounds: Normal heart sounds.  Pulmonary:     Effort: Pulmonary effort is normal.     Breath sounds: Normal breath sounds.  Abdominal:     General: Abdomen is flat.     Palpations:  Abdomen is soft.  Musculoskeletal:     Right lower leg: No edema.     Left lower leg: No edema.  Skin:    General: Skin is warm and dry.     Capillary Refill: Capillary refill takes less than 2 seconds.  Neurological:     General: No focal deficit present.     Mental Status: He is alert and oriented to person, place, and time.  Psychiatric:        Mood and Affect: Mood normal.        Behavior: Behavior normal.        Thought Content: Thought content normal.        Judgment: Judgment normal.     ASSESSMENT AND PLAN:    Assessment &  Plan Palpitations Intermittent palpitations primarily at night when supine, lasting 15-30 seconds. No significant EKG changes. Possible association with hyperthyroidism and testosterone therapy. Atenolol prescribed by endocrinologist without symptom improvement.  - Order heart monitor for longitudinal rhythm monitoring. - Order echocardiogram to evaluate structural and valve abnormalities. - Adjust atenolol dosage if symptoms worsen or increase in frequency.  Hypertension Hypertension appears generally well-managed with atenolol. Blood pressure readings variable. Potential contributing factors include hyperthyroidism and suspected obstructive sleep apnea. Weight loss and improved sleep may aid in management. - Continue atenolol for blood pressure management. - Facilitate sleep study to evaluate for obstructive sleep apnea. - Encourage weight loss through balanced diet and calorie reduction.  Obstructive Sleep Apnea (suspected) Suspected based on loud snoring and high risk score. Potential contributor to hypertension and palpitations. May be exacerbated by weight gain and erratic sleep schedule. Untreated can lead to hypertension, arrhythmias, and congestive heart failure. - Order home sleep study to confirm diagnosis. - Encourage weight loss to alleviate symptoms.  Hyperthyroidism Diagnosed with hyperthyroidism, managed with methimazole and atenolol. Recent TSH level at 0.641. Methimazole adjusted to every other day due to over-suppression.  - Continue follow up with endocrinologist.          Signed, Artist Pouch, PA-C

## 2024-05-20 ENCOUNTER — Telehealth: Payer: Self-pay

## 2024-05-20 ENCOUNTER — Ambulatory Visit: Attending: Cardiovascular Disease

## 2024-05-20 ENCOUNTER — Encounter (INDEPENDENT_AMBULATORY_CARE_PROVIDER_SITE_OTHER): Payer: Self-pay | Admitting: Cardiology

## 2024-05-20 DIAGNOSIS — G4733 Obstructive sleep apnea (adult) (pediatric): Secondary | ICD-10-CM | POA: Diagnosis not present

## 2024-05-20 NOTE — Telephone Encounter (Signed)
**Note De-Identified Chessica Audia Obfuscation** Ordering provider: Artist Pouch, PA-c Associated diagnoses: Snoring-R06.83 and Palpitations-R00.2  WatchPAT PA obtained on 05/20/2024 by Capria Cartaya, Avelina HERO, LPN. Authorization: I called [employer] and per the virtual assistant a PA is not required for CPT Code: 95800-Itamar-HST. Confirmation #: [id number]  Patient notified of PIN ([phone]) on 05/20/2024 Rovena Hearld Notification Method: phone.  Phone note routed to covering staff for follow-up.

## 2024-05-20 NOTE — Telephone Encounter (Signed)
**Note De-Identified Carely Nappier Obfuscation** I called the pt to provide him with his PIn # for his WatchPAT One-HST device and while on the call the pt stated that one of the heart monitor electrodes/patch is no longer sticking to his skin as it should. He is requesting a call back with recommendation on what he should do.  He is aware that I am sending this note to the heart monitor team to return his call. He stated that if he does not answer to please leave a message and he will call back.

## 2024-05-20 NOTE — Telephone Encounter (Signed)
 Patient coming in 05/20/24 , 4:00 PM to have a replacement ZIO XT applied with tincture of benzoin.

## 2024-05-24 ENCOUNTER — Encounter: Payer: Self-pay | Admitting: Advanced Practice Midwife

## 2024-05-26 NOTE — Procedures (Signed)
    SLEEP STUDY REPORT Patient Information Study Date: 05/20/2024 Patient Name: Drew Cameron Patient ID: [id number] Birth Date: 07/10/1981 Age: 43 Gender: Male BMI: 37.2 (W=251 lb, H=5' 9'') Referring Physician: Artist Pouch, PA  TEST DESCRIPTION: Home sleep apnea testing was completed using the WatchPat, a Type 1 device, utilizing peripheral arterial tonometry (PAT), chest movement, actigraphy, pulse oximetry, pulse rate, body position and snore. AHI was calculated with apnea and hypopnea using valid sleep time as the denominator. RDI includes apneas, hypopneas, and RERAs. The data acquired and the scoring of sleep and all associated events were performed in accordance with the recommended standards and specifications as outlined in the AASM Manual for the Scoring of Sleep and Associated Events 2.2.0 (2015).  FINDINGS:  1. Mild Obstructive Sleep Apnea with AHI12.5 /hr.  2. No Central Sleep Apnea with pAHIc 0.9/hr.  3. Oxygen desaturations as low as 85%.  4. Moderate to severe snoring was present. O2 sats were < 88% for 3.1 min.  5. Total sleep time was 6 hrs and 27 min.  6. 29.6% of total sleep time was spent in REM sleep.  7. Normal sleep onset latency at 22 min.  8. Shortened REM sleep onset latency at 46 min.  9. Total awakenings were 7. 10. Arrhythmia detection: None  DIAGNOSIS: Mild Obstructive Sleep Apnea (G47.33)  RECOMMENDATIONS: 1. Clinical correlation of these findings is necessary. The decision to treat obstructive sleep apnea (OSA) is usually based on the presence of apnea symptoms or the presence of associated medical conditions such as Hypertension, Congestive Heart Failure, Atrial Fibrillation or Obesity. The most common symptoms of OSA are snoring, gasping for breath while sleeping, daytime sleepiness and fatigue. 2. Initiating apnea therapy is recommended given the presence of symptoms and/or associated conditions. Recommend proceeding with one of the  following:  a. Auto-CPAP therapy with a pressure range of 5-20cm H2O.  b. An oral appliance (OA) that can be obtained from certain dentists with expertise in sleep medicine. These are primarily of use in non-obese patients with mild and moderate disease.  c. An ENT consultation which may be useful to look for specific causes of obstruction and possible treatment options.  d. If patient is intolerant to PAP therapy, consider referral to ENT for evaluation for hypoglossal nerve stimulator. 3. Close follow-up is necessary to ensure success with CPAP or oral appliance therapy for maximum benefit . 4. A follow-up oximetry study on CPAP is recommended to assess the adequacy of therapy and determine the need for supplemental oxygen or the potential need for Bi-level therapy. An arterial blood gas to determine the adequacy of baseline ventilation and oxygenation should also be considered. 5. Healthy sleep recommendations include: adequate nightly sleep (normal 7-9 hrs/night), avoidance of caffeine after noon and alcohol near bedtime, and maintaining a sleep environment that is cool, dark and quiet. 6. Weight loss for overweight patients is recommended. Even modest amounts of weight loss can significantly improve the severity of sleep apnea. 7. Snoring recommendations include: weight loss where appropriate, side sleeping, and avoidance of alcohol before bed. 8. Operation of motor vehicle should be avoided when sleepy.  Signature: Wilbert Bihari, MD; [facility]; Diplomat, American Board of Sleep Medicine Electronically Signed: 05/26/2024 8:57:27 PM

## 2024-05-27 ENCOUNTER — Ambulatory Visit: Attending: Cardiology

## 2024-05-27 DIAGNOSIS — R0683 Snoring: Secondary | ICD-10-CM

## 2024-05-27 DIAGNOSIS — R002 Palpitations: Secondary | ICD-10-CM

## 2024-05-27 NOTE — Telephone Encounter (Signed)
 Pt wore ITAMAR device and sleep study results have been completed and signed by Dr. Shlomo on 05/26/24.  Will close this encounter being test has been completed.

## 2024-05-29 ENCOUNTER — Telehealth: Payer: Self-pay | Admitting: *Deleted

## 2024-05-29 DIAGNOSIS — R0683 Snoring: Secondary | ICD-10-CM

## 2024-05-29 DIAGNOSIS — G4733 Obstructive sleep apnea (adult) (pediatric): Secondary | ICD-10-CM

## 2024-05-29 NOTE — Telephone Encounter (Signed)
-----   Message from Wilbert Bihari sent at 05/26/2024  8:59 PM EDT ----- Please let patient know that they have sleep apnea and recommend treating with CPAP.  Please order an auto CPAP from 4-15cm H2O with heated humidity and mask of choice.  Order overnight pulse ox on CPAP.  Followup with me in 6 weeks.

## 2024-05-29 NOTE — Telephone Encounter (Addendum)
 The patient has been notified of the result and verbalized understanding.  All questions (if any) were answered. Drew Cameron, CMA 05/29/2024 12:05 PM     Upon patient request DME selection is [facility]. Patient understands he will be contacted by [facility] to set up his cpap. Patient understands to call if [facility] does not contact him with new setup in a timely manner. Patient understands they will be called once confirmation has been received from [facility]/ that they have received their new machine to schedule 10 week follow up appointment.   [facility] notified of new cpap order  Please add to airview Patient was grateful for the call and thanked me.

## 2024-05-29 NOTE — Telephone Encounter (Signed)
 Patient is following up. He would like to review this information again if possible.

## 2024-05-30 NOTE — Telephone Encounter (Signed)
 Patient had questions about other options for his cpap machine. It was explained to him that the inspire device and the oral appliance both require you to have tried cpap for at least 6 months.  Patient was grateful for the call and thanked me.

## 2024-06-12 ENCOUNTER — Ambulatory Visit: Payer: Self-pay | Admitting: Cardiology

## 2024-06-12 DIAGNOSIS — R002 Palpitations: Secondary | ICD-10-CM | POA: Diagnosis not present

## 2024-06-25 ENCOUNTER — Ambulatory Visit (HOSPITAL_COMMUNITY)
Admission: RE | Admit: 2024-06-25 | Discharge: 2024-06-25 | Disposition: A | Discharge status: Home / Self Care | Source: Ambulatory Visit | Attending: Internal Medicine | Admitting: Internal Medicine

## 2024-06-25 DIAGNOSIS — R002 Palpitations: Secondary | ICD-10-CM

## 2024-06-25 DIAGNOSIS — I10 Essential (primary) hypertension: Secondary | ICD-10-CM

## 2024-06-25 LAB — ECHOCARDIOGRAM COMPLETE
Area-P 1/2: 5.93 cm2
S' Lateral: 2.7 cm

## 2024-09-23 ENCOUNTER — Other Ambulatory Visit: Payer: Self-pay | Admitting: Nurse Practitioner
